# Patient Record
Sex: Female | Born: 1975 | Race: White | Hispanic: No | Marital: Married | Smoking: Never smoker
Health system: Southern US, Community
[De-identification: ages and names within clinical notes are randomized; demographics above are authoritative.]

## PROBLEM LIST (undated history)

## (undated) DIAGNOSIS — J45909 Unspecified asthma, uncomplicated: Secondary | ICD-10-CM

## (undated) DIAGNOSIS — R87629 Unspecified abnormal cytological findings in specimens from vagina: Secondary | ICD-10-CM

## (undated) DIAGNOSIS — G47419 Narcolepsy without cataplexy: Secondary | ICD-10-CM

## (undated) DIAGNOSIS — B009 Herpesviral infection, unspecified: Secondary | ICD-10-CM

## (undated) DIAGNOSIS — E042 Nontoxic multinodular goiter: Secondary | ICD-10-CM

## (undated) DIAGNOSIS — Z8719 Personal history of other diseases of the digestive system: Secondary | ICD-10-CM

## (undated) DIAGNOSIS — K589 Irritable bowel syndrome without diarrhea: Secondary | ICD-10-CM

## (undated) DIAGNOSIS — O09529 Supervision of elderly multigravida, unspecified trimester: Secondary | ICD-10-CM

## (undated) DIAGNOSIS — I639 Cerebral infarction, unspecified: Secondary | ICD-10-CM

## (undated) HISTORY — DX: Herpesviral infection, unspecified: B00.9

## (undated) HISTORY — PX: EYE SURGERY: SHX253

## (undated) HISTORY — DX: Nontoxic multinodular goiter: E04.2

## (undated) HISTORY — DX: Unspecified asthma, uncomplicated: J45.909

## (undated) HISTORY — DX: Personal history of other diseases of the digestive system: Z87.19

## (undated) HISTORY — DX: Cerebral infarction, unspecified: I63.9

## (undated) HISTORY — DX: Irritable bowel syndrome, unspecified: K58.9

## (undated) HISTORY — PX: DILATION AND CURETTAGE OF UTERUS: SHX78

## (undated) HISTORY — DX: Supervision of elderly multigravida, unspecified trimester: O09.529

## (undated) HISTORY — DX: Narcolepsy without cataplexy: G47.419

## (undated) HISTORY — PX: BREAST SURGERY: SHX581

## (undated) HISTORY — DX: Unspecified abnormal cytological findings in specimens from vagina: R87.629

---

## 2009-11-10 LAB — CONVERTED CEMR LAB: Pap Smear: NORMAL

## 2010-01-13 ENCOUNTER — Ambulatory Visit (HOSPITAL_BASED_OUTPATIENT_CLINIC_OR_DEPARTMENT_OTHER)
Admission: RE | Admit: 2010-01-13 | Discharge: 2010-01-13 | Payer: Self-pay | Source: Home / Self Care | Admitting: Orthopaedic Surgery

## 2010-01-13 ENCOUNTER — Ambulatory Visit: Payer: Self-pay | Admitting: Diagnostic Radiology

## 2010-04-01 ENCOUNTER — Ambulatory Visit: Payer: Self-pay | Admitting: Internal Medicine

## 2010-04-01 DIAGNOSIS — J45909 Unspecified asthma, uncomplicated: Secondary | ICD-10-CM | POA: Insufficient documentation

## 2010-04-01 DIAGNOSIS — R51 Headache: Secondary | ICD-10-CM

## 2010-04-01 DIAGNOSIS — F329 Major depressive disorder, single episode, unspecified: Secondary | ICD-10-CM

## 2010-04-01 DIAGNOSIS — R350 Frequency of micturition: Secondary | ICD-10-CM

## 2010-04-01 DIAGNOSIS — R519 Headache, unspecified: Secondary | ICD-10-CM | POA: Insufficient documentation

## 2010-04-01 DIAGNOSIS — Z8679 Personal history of other diseases of the circulatory system: Secondary | ICD-10-CM | POA: Insufficient documentation

## 2010-04-01 LAB — CONVERTED CEMR LAB
Beta hcg, urine, semiquantitative: NEGATIVE
Bilirubin Urine: NEGATIVE
Blood in Urine, dipstick: NEGATIVE
GC Probe Amp, Urine: NEGATIVE
Glucose, Urine, Semiquant: NEGATIVE
Nitrite: NEGATIVE
Protein, U semiquant: 30
Specific Gravity, Urine: >=1.03
Urobilinogen, UA: 0.2
WBC Urine, dipstick: NEGATIVE
pH: 5

## 2010-04-02 ENCOUNTER — Telehealth: Payer: Self-pay | Admitting: Internal Medicine

## 2010-04-06 ENCOUNTER — Telehealth: Payer: Self-pay | Admitting: Internal Medicine

## 2010-04-08 ENCOUNTER — Encounter: Payer: Self-pay | Admitting: Internal Medicine

## 2010-05-05 ENCOUNTER — Encounter: Payer: Self-pay | Admitting: Internal Medicine

## 2010-05-05 ENCOUNTER — Ambulatory Visit: Payer: Self-pay | Admitting: Family

## 2010-05-05 DIAGNOSIS — N926 Irregular menstruation, unspecified: Secondary | ICD-10-CM | POA: Insufficient documentation

## 2010-05-05 DIAGNOSIS — H6690 Otitis media, unspecified, unspecified ear: Secondary | ICD-10-CM | POA: Insufficient documentation

## 2010-05-05 DIAGNOSIS — N939 Abnormal uterine and vaginal bleeding, unspecified: Secondary | ICD-10-CM

## 2010-05-05 LAB — CONVERTED CEMR LAB
Beta hcg, urine, semiquantitative: NEGATIVE
Bilirubin Urine: NEGATIVE
Blood in Urine, dipstick: NEGATIVE
Estradiol: 26.2 pg/mL
FSH: 7.5 milliintl units/mL
Glucose, Urine, Semiquant: NEGATIVE
Ketones, urine, test strip: NEGATIVE
LH: 4.3 milliintl units/mL
Nitrite: NEGATIVE
Protein, U semiquant: NEGATIVE
Specific Gravity, Urine: 1.025
TSH: 0.627 microintl units/mL (ref 0.350–4.500)
Urobilinogen, UA: 0.2
WBC Urine, dipstick: NEGATIVE
pH: 6.5

## 2010-05-06 ENCOUNTER — Telehealth: Payer: Self-pay | Admitting: Internal Medicine

## 2010-05-06 LAB — CONVERTED CEMR LAB
Candida species: NEGATIVE
Chlamydia, DNA Probe: NEGATIVE
GC Probe Amp, Genital: NEGATIVE
Gardnerella vaginalis: NEGATIVE
Trichomonal Vaginitis: NEGATIVE

## 2010-05-07 ENCOUNTER — Telehealth: Payer: Self-pay | Admitting: Family

## 2010-05-07 ENCOUNTER — Encounter: Payer: Self-pay | Admitting: Family

## 2010-05-13 ENCOUNTER — Ambulatory Visit (HOSPITAL_BASED_OUTPATIENT_CLINIC_OR_DEPARTMENT_OTHER): Admission: RE | Admit: 2010-05-13 | Discharge: 2010-05-13 | Payer: Self-pay | Admitting: Internal Medicine

## 2010-05-13 ENCOUNTER — Ambulatory Visit: Payer: Self-pay | Admitting: Diagnostic Radiology

## 2010-05-14 ENCOUNTER — Telehealth: Payer: Self-pay | Admitting: Family

## 2010-08-31 NOTE — Letter (Signed)
   [facility] [address] [place], [place]  [address]  [place] Phone: [phone]      May 07, 2010   Bianca Jones [address] [place], [place] [address]  RE:  LAB RESULTS  Dear  Ms. Gatchell,  The following is an interpretation of your most recent lab tests.  Please take note of any instructions provided or changes to medications that have resulted from your lab work.  THYROID STUDIES:  Thyroid studies normal TSH: 0.627    Your hormonal studies are normal for your age.  Please complete your upcoming ultrasound.     Sincerely Yours,    Lemont Fillers FNP  Appended Document:  Mailed.

## 2010-08-31 NOTE — Consult Note (Signed)
Summary: [facility]  [facility]   Imported By: Lanelle Bal 04/27/2010 09:43:21  _____________________________________________________________________  External Attachment:    Type:   Image     Comment:   External Document

## 2010-08-31 NOTE — Progress Notes (Signed)
Summary: lab results  Phone Note Outgoing Call   Summary of Call: Please call patient and let her know that her hormonal studies and ultrasound are normal. Initial call taken by: Lemont Fillers FNP,  May 14, 2010 8:18 AM  Follow-up for Phone Call        Left detailed message re: results on pt's cell and to call if any questions. Nicki Guadalajara Fergerson CMA Duncan Dull)  May 14, 2010 11:54 AM

## 2010-08-31 NOTE — Progress Notes (Signed)
Summary: lab results  Phone Note Outgoing Call   Summary of Call: Please call patient and let her know that her wet prep is normal.  GC/Chlamydia negative.  Hormone studies normal. Initial call taken by: Lemont Fillers FNP,  May 07, 2010 10:15 AM  Follow-up for Phone Call        Notified pt per [place] instructions. Advised pt we will call her when urine culture and u/s results come back. Pt states she will be having u/s this coming thursday. Nicki Guadalajara Fergerson CMA Duncan Dull)  May 07, 2010 10:46 AM

## 2010-08-31 NOTE — Progress Notes (Signed)
  Phone Note Outgoing Call   Summary of Call: pt still having urinary symptoms.  discussed urine culture results.   tx with cipro x 7 days Initial call taken by: D. Thomos Lemons DO,  April 06, 2010 6:18 PM    New/Updated Medications: CIPROFLOXACIN HCL 500 MG TABS (CIPROFLOXACIN HCL) one by mouth two times a day Prescriptions: CIPROFLOXACIN HCL 500 MG TABS (CIPROFLOXACIN HCL) one by mouth two times a day  #14 x 0   Entered and Authorized by:   D. Thomos Lemons DO   Signed by:   D. Thomos Lemons DO on 04/06/2010   Method used:   Electronically to        [facility] (retail)       [address]       [place], [place]  [address]       Ph: [phone]       Fax: [phone]   RxID:   [id number]

## 2010-08-31 NOTE — Progress Notes (Signed)
Summary: Bianca Jones needs to change test code  Phone Note Other Incoming Call back at [phone]   Caller: [employer] Bianca Jones Summary of Call: Pls call to give permission to use test code [id number] Initial call taken by: Lannette Donath,  May 06, 2010 11:14 AM  Follow-up for Phone Call        call was returned to [facility] customer service Laurelyn Sickle she states test ordered for the wet prep was by [facility] and they need it to be molecular probe. She was advised the test could be changed as requested Follow-up by: Glendell Docker CMA,  May 06, 2010 12:01 PM

## 2010-08-31 NOTE — Assessment & Plan Note (Signed)
Summary: Pap only/    Hea--Rm 5   Vital Signs:  Patient profile:   35 year old female Menstrual status:  irregular LMP:     05/04/2010 Height:      66 inches Weight:      159 pounds BMI:     25.76 Temp:     98.3 degrees F oral Pulse rate:   84 / minute Pulse rhythm:   regular Resp:     18 per minute BP sitting:   112 / 84  (right arm) Cuff size:   regular  Vitals Entered By: Mervin Kung CMA Duncan Dull) (May 05, 2010 3:47 PM) CC: Rm 5   Pt here for pap smear.  Pt states cycle has been irregular for the last 3 months. Heavy bleeding yesterday. No bleeding today. Is Patient Diabetic? No Pain Assessment Patient in pain? yes     Location: lower abdomen and lower back Intensity: uncomfortable feeling at present Comments Pt has completed Cipro. Takes Nuvigil for Narcolepsy. Nicki Guadalajara Fergerson CMA Duncan Dull)  May 05, 2010 3:55 PM  LMP (date): 05/04/2010     Menstrual Status irregular Enter LMP: 05/04/2010 Last PAP Result normal   Primary Care Provider:  Dondra Spry DO  CC:  Rm 5   Pt here for pap smear.  Pt states cycle has been irregular for the last 3 months. Heavy bleeding yesterday. No bleeding today.Marland Kitchen  History of Present Illness: Ms Stavola is a 35 year old female who presents today with complaint of irregular periods, 3 periods in 5 weeks.  + abdominal pain/cramping, + history of ovarian cysts.  Denies dysuria. Denies vaginal discharge.  + vaginal odor.  1 sexual partner in 3-4 years.   Some difficulty defecating at times.  Denies constipation.  She notes that she had an MRI of her L/S spine which was negative.  Review of [place] records reveals that this was ordered by Dr. Norlene Campbell and revealed right foraminal protrusion at L3-4 contacts the right L3 root without compressing it, as well asd isc bulge eccentric the left at L5-S1 slightly displaces the descending left S1 root without compressing it.   Patient also reports recent URI with fatigue.    Allergies  (verified): No Known Drug Allergies  Physical Exam  General:  Well-developed,well-nourished,in no acute distress; alert,appropriate and cooperative throughout examination Head:  Normocephalic and atraumatic without obvious abnormalities. No apparent alopecia or balding. Ears:  R TM with + erythema.  L TM normal. Mouth:  Oral mucosa and oropharynx without lesions or exudates.  Teeth in good repair. Lungs:  Normal respiratory effort, chest expands symmetrically. Lungs are clear to auscultation, no crackles or wheezes. Heart:  Normal rate and regular rhythm. S1 and S2 normal without gallop, murmur, click, rub or other extra sounds. Abdomen:  Bowel sounds positive,abdomen soft and non-tender without masses, organomegaly or hernias noted. Genitalia:  Pelvic Exam:        External: normal female genitalia without lesions or masses        Vagina: normal without lesions or masses        Cervix: normal without lesions or masses        Adnexa: normal bimanual exam without masses or fullness        Uterus: normal by palpation        Pap smear: not performed   Impression & Recommendations:  Problem # 1:  MENSTRUAL BLEEDING, ABNORMAL (ICD-626.9) Assessment New Pap was completed in the spring.  Due to  patient's complaints will order wet prep and GC/Chlamydia.  Check hormonal studies and will send for a transvaginal US to rule out fibroids.   Orders: T-FSH [id number]) T-LH [id number]) T-Estradiol [id number]) T-TSH [id number]) T-Wet Prep [phone]) T-Chlamydia & GC Probe, Genital ([id number]) Urine Pregnancy Test  [phone]) Misc. Referral (Misc. Ref)  Problem # 2:  OTITIS MEDIA, RIGHT (ICD-382.9) Assessment: New Will treat with Ceftin.   The following medications were removed from the medication list:    Ciprofloxacin Hcl 500 Mg Tabs (Ciprofloxacin hcl) ..... One by mouth two times a day Her updated medication list for this problem includes:    Ceftin 500 Mg Tabs (Cefuroxime  axetil) ..... One tablet by mouth two times a day x 7 days  Complete Medication List: 1)  Nuvigil 150 Mg Tabs (Armodafinil) .... Take 1 tablet every morning and 1/2 @ 4pm as needed 2)  Ceftin 500 Mg Tabs (Cefuroxime axetil) .... One tablet by mouth two times a day x 7 days  Other Orders: UA Dipstick w/o Micro (manual) ([id number]) Specimen Handling (99000) T-Culture, Urine ([id number])  Patient Instructions: 1)  Follow up with Dr. Artist Pais in 1 month. 2)  Call if your symptoms worsen or do not improve. Prescriptions: CEFTIN 500 MG TABS (CEFUROXIME AXETIL) one tablet by mouth two times a day x 7 days  #14 x 0   Entered and Authorized by:   Lemont Fillers FNP   Signed by:   Lemont Fillers FNP on 05/05/2010   Method used:   Electronically to        [facility] (retail)       [address]       [place], [place]  [address]       Ph: [phone]       Fax: [phone]   RxID:   [id number]   Current Allergies (reviewed today): No known allergies     Laboratory Results   Urine Tests   Date/Time Reported: Mervin Kung CMA Duncan Dull)  May 05, 2010 4:35 PM   Routine Urinalysis   Color: lt. yellow Appearance: Clear Glucose: negative   (Normal Range: Negative) Bilirubin: negative   (Normal Range: Negative) Ketone: negative   (Normal Range: Negative) Spec. Gravity: 1.025   (Normal Range: 1.003-1.035) Blood: negative   (Normal Range: Negative) pH: 6.5   (Normal Range: 5.0-8.0) Protein: negative   (Normal Range: Negative) Urobilinogen: 0.2   (Normal Range: 0-1) Nitrite: negative   (Normal Range: Negative) Leukocyte Esterace: negative   (Normal Range: Negative)    Urine HCG: negative Comments: Urine cultured. Nicki Guadalajara Fergerson CMA Duncan Dull)  May 05, 2010 4:36 PM

## 2010-08-31 NOTE — Progress Notes (Signed)
Summary: Lab Results  Phone Note Outgoing Call   Summary of Call: call pt - urine test for chlamydia - negative Initial call taken by: D. Thomos Lemons DO,  April 02, 2010 6:20 PM  Follow-up for Phone Call        call placed to patient at [phone], no answer, a detailed voice message left informing patient per Dr Artist Pais instructions. Message left for patient to call if any questions Follow-up by: Glendell Docker CMA,  April 06, 2010 11:31 AM

## 2010-08-31 NOTE — Assessment & Plan Note (Signed)
Summary: NEW PT EST CARE/DT   Vital Signs:  Patient profile:   35 year old female Menstrual status:  regular LMP:     03/25/2010 Height:      66 inches Weight:      160 pounds BMI:     25.92 O2 Sat:      99 % on Room air Temp:     98.1 degrees F oral Pulse rate:   86 / minute Pulse rhythm:   regular Resp:     20 per minute BP sitting:   100 / 68  (right arm) Cuff size:   large  Vitals Entered By: Glendell Docker CMA (April 01, 2010 2:58 PM)  O2 Flow:  Room air CC: New Patient  Is Patient Diabetic? No Pain Assessment Patient in pain? no      LMP (date): 03/25/2010     Menstrual Status regular Enter LMP: 03/25/2010 Last PAP Result normal   Primary Care Provider:  Dondra Spry DO  CC:  New Patient .  History of Present Illness: 35 y/o white female to establish hx of chronic back pain sees chiropractor  Bladder - pressure sensation freq, some urgency, occ fever, no burning poor muscle control, feels like she has to push to get urine out drinking more fluids  3 months  went to [place] - suggested bladder scan no blood in urine no hx of chronic urinary infections no otc supplements  No vaginal symptoms nuvagil Dr. Bascom Levels - GYN HH - antispasmodic,  on hycosamine  Preventive Screening-Counseling & Management  Alcohol-Tobacco     Alcohol drinks/day: <1     Smoking Status: never  Caffeine-Diet-Exercise     Caffeine use/day: 1 beverage daily     Does Patient Exercise: no  Past History:  Past Medical History: Childhood Asthma Situational Depression (college - family member died and break up)  stayed on zoloft x 2,  then tapered off - (fam hx of depression Aunt) Headaches - worse after small stroke Cerebrovascular accident, hx of - (2008) speech was abnormal,  MRI was abnormal showing small CVA  followed by neurologist - Dr Albertina Senegal (no specific etiology)  subsequent MRIs was normal Hx of narcolepsy  Past Surgical History: Bilateral eye  msucle surgery  1977 Right Breast Biopsy in 1999  Family History: Family History of Arthritis -father (RA and OA)  age 23 Family History of CAD Female - GF Family History High cholesterol - GF, both parents Family History Hypertension - mother aunt died of lung cancer no family hx of lupus one brother   Social History: Occupation: Runner, 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    Physical Exam  General:  alert, well-developed, and well-nourished.   Head:  normocephalic and atraumatic.   Eyes:  pupils equal, pupils round, and pupils reactive to light.   Ears:  R ear normal and L ear normal.   Mouth:  good dentition, no gingival abnormalities, no dental plaque, and pharynx pink and moist.   Neck:  No deformities, masses, or tenderness noted.no carotid bruits.   Lungs:  normal respiratory effort, normal breath sounds, no crackles, and no wheezes.   Heart:  normal rate, regular rhythm, no murmur, and no gallop.  Abdomen:  soft, non-tender, normal bowel sounds, no masses, no hepatomegaly, and no splenomegaly.   Extremities:  No lower extremity edema  Neurologic:  cranial nerves II-XII intact and gait normal.   Psych:  normally interactive and good eye contact.     Impression & Recommendations:  Problem # 1:  FREQUENCY, URINARY (ICD-788.41)  u/a is negative - check chlamydia probe  refer to urology for further eval consider interstitial cystitis she also has unusual hx of small CVA in the past question neurogenic bladder  If urodynamics show neurogenic bladder - consider reassess neurolgic status  Orders: T-Culture, Urine ([id number])  Orders: T-Culture, Urine  ([id number])  Problem # 2:  CEREBROVASCULAR ACCIDENT, HX OF (ICD-V12.50) Obtain copy of prev records  Complete Medication List: 1)  Ciprofloxacin Hcl 500 Mg Tabs (Ciprofloxacin hcl) .... One by mouth two times a day  Other Orders: T-GC Probe, urine ([id number]) Specimen Handling ([id number])  Patient Instructions: 1)  Please schedule a follow-up appointment in 1 month. Prescriptions: DOXYCYCLINE HYCLATE 100 MG TABS (DOXYCYCLINE HYCLATE) one by mouth two times a day  #14 x 0   Entered and Authorized by:   D. Thomos Lemons DO   Signed by:   D. Thomos Lemons DO on 04/01/2010   Method used:   Print then Give to Patient   RxID:   [id number]    Preventive Care Screening  Pap Smear:    Date:  11/10/2009    Results:  normal   Last Tetanus Booster:    Date:  04/19/2005    Results:  Historical     Laboratory Results   Urine Tests    Routine Urinalysis   Color: straw Appearance: Hazy Glucose: negative   (Normal Range: Negative) Bilirubin: negative   (Normal Range: Negative) Ketone: moderate (40)   (Normal Range: Negative) Spec. Gravity: >=1.030   (Normal Range: 1.003-1.035) Blood: negative   (Normal Range: Negative) pH: 5.0   (Normal Range: 5.0-8.0) Protein: 30   (Normal Range: Negative) Urobilinogen: 0.2   (Normal Range: 0-1) Nitrite: negative   (Normal Range: Negative) Leukocyte Esterace: negative   (Normal Range: Negative)    Urine HCG: negative

## 2013-05-28 LAB — OB RESULTS CONSOLE HIV ANTIBODY (ROUTINE TESTING): HIV: NONREACTIVE

## 2013-05-28 LAB — OB RESULTS CONSOLE RPR: RPR: NONREACTIVE

## 2013-05-28 LAB — OB RESULTS CONSOLE HEPATITIS B SURFACE ANTIGEN: Hepatitis B Surface Ag: NEGATIVE

## 2013-05-28 LAB — OB RESULTS CONSOLE ABO/RH: RH Type: POSITIVE

## 2013-05-28 LAB — OB RESULTS CONSOLE RUBELLA ANTIBODY, IGM: Rubella: IMMUNE

## 2013-05-28 LAB — OB RESULTS CONSOLE ANTIBODY SCREEN: Antibody Screen: NEGATIVE

## 2013-06-16 LAB — OB RESULTS CONSOLE GC/CHLAMYDIA: Chlamydia: POSITIVE

## 2013-11-26 LAB — OB RESULTS CONSOLE GBS: GBS: NEGATIVE

## 2013-12-26 ENCOUNTER — Telehealth (HOSPITAL_COMMUNITY): Payer: Self-pay | Admitting: *Deleted

## 2013-12-26 ENCOUNTER — Encounter (HOSPITAL_COMMUNITY): Payer: Self-pay | Admitting: *Deleted

## 2013-12-27 ENCOUNTER — Encounter (HOSPITAL_COMMUNITY): Payer: Self-pay | Admitting: *Deleted

## 2013-12-27 NOTE — Telephone Encounter (Signed)
Preadmission screen  

## 2013-12-30 ENCOUNTER — Inpatient Hospital Stay (HOSPITAL_COMMUNITY)
Admission: RE | Admit: 2013-12-30 | Discharge: 2014-01-03 | DRG: 765 | Disposition: A | Discharge status: Home / Self Care | Payer: BC Managed Care – PPO | Source: Ambulatory Visit | Attending: Obstetrics and Gynecology | Admitting: Obstetrics and Gynecology

## 2013-12-30 ENCOUNTER — Encounter (HOSPITAL_COMMUNITY): Payer: Self-pay

## 2013-12-30 VITALS — BP 100/66 | HR 75 | Temp 97.5°F | Resp 18 | Ht 67.0 in | Wt 195.0 lb

## 2013-12-30 DIAGNOSIS — O99344 Other mental disorders complicating childbirth: Secondary | ICD-10-CM | POA: Diagnosis present

## 2013-12-30 DIAGNOSIS — Z8249 Family history of ischemic heart disease and other diseases of the circulatory system: Secondary | ICD-10-CM

## 2013-12-30 DIAGNOSIS — F329 Major depressive disorder, single episode, unspecified: Secondary | ICD-10-CM | POA: Diagnosis present

## 2013-12-30 DIAGNOSIS — O480 Post-term pregnancy: Secondary | ICD-10-CM | POA: Diagnosis present

## 2013-12-30 DIAGNOSIS — Z98891 History of uterine scar from previous surgery: Secondary | ICD-10-CM

## 2013-12-30 DIAGNOSIS — F3289 Other specified depressive episodes: Secondary | ICD-10-CM | POA: Diagnosis present

## 2013-12-30 DIAGNOSIS — O98519 Other viral diseases complicating pregnancy, unspecified trimester: Secondary | ICD-10-CM | POA: Diagnosis present

## 2013-12-30 DIAGNOSIS — K449 Diaphragmatic hernia without obstruction or gangrene: Secondary | ICD-10-CM | POA: Diagnosis present

## 2013-12-30 DIAGNOSIS — K219 Gastro-esophageal reflux disease without esophagitis: Secondary | ICD-10-CM | POA: Diagnosis present

## 2013-12-30 DIAGNOSIS — O09519 Supervision of elderly primigravida, unspecified trimester: Secondary | ICD-10-CM | POA: Diagnosis present

## 2013-12-30 DIAGNOSIS — J45909 Unspecified asthma, uncomplicated: Secondary | ICD-10-CM | POA: Diagnosis present

## 2013-12-30 DIAGNOSIS — D649 Anemia, unspecified: Secondary | ICD-10-CM | POA: Diagnosis present

## 2013-12-30 DIAGNOSIS — A6000 Herpesviral infection of urogenital system, unspecified: Secondary | ICD-10-CM | POA: Diagnosis present

## 2013-12-30 DIAGNOSIS — Z3490 Encounter for supervision of normal pregnancy, unspecified, unspecified trimester: Secondary | ICD-10-CM

## 2013-12-30 DIAGNOSIS — O9902 Anemia complicating childbirth: Secondary | ICD-10-CM | POA: Diagnosis present

## 2013-12-30 DIAGNOSIS — Z8673 Personal history of transient ischemic attack (TIA), and cerebral infarction without residual deficits: Secondary | ICD-10-CM

## 2013-12-30 DIAGNOSIS — K589 Irritable bowel syndrome without diarrhea: Secondary | ICD-10-CM | POA: Diagnosis present

## 2013-12-30 LAB — CBC
HEMATOCRIT: 33.8 % — AB (ref 36.0–46.0)
HEMOGLOBIN: 11.5 g/dL — AB (ref 12.0–15.0)
MCH: 29.9 pg (ref 26.0–34.0)
MCHC: 34 g/dL (ref 30.0–36.0)
MCV: 88 fL (ref 78.0–100.0)
Platelets: 251 10*3/uL (ref 150–400)
RBC: 3.84 MIL/uL — ABNORMAL LOW (ref 3.87–5.11)
RDW: 13.2 % (ref 11.5–15.5)
WBC: 13.9 10*3/uL — ABNORMAL HIGH (ref 4.0–10.5)

## 2013-12-30 MED ORDER — OXYTOCIN 40 UNITS IN LACTATED RINGERS INFUSION - SIMPLE MED: 62.5000 mL/h | INTRAVENOUS | Status: DC

## 2013-12-30 MED ORDER — LACTATED RINGERS IV SOLN: 500.0000 mL | INTRAVENOUS | Status: DC | PRN

## 2013-12-30 MED ORDER — LIDOCAINE HCL (PF) 1 % IJ SOLN: 30.0000 mL | INTRAMUSCULAR | Status: DC | PRN

## 2013-12-30 MED ORDER — MISOPROSTOL 25 MCG QUARTER TABLET
25.0000 ug | ORAL_TABLET | ORAL | Status: AC | PRN
  Administered 2013-12-30 – 2013-12-31 (×2): 25 ug via VAGINAL
  Filled 2013-12-30 (×2): qty 0.25

## 2013-12-30 MED ORDER — OXYTOCIN 40 UNITS IN LACTATED RINGERS INFUSION - SIMPLE MED
1.0000 m[IU]/min | INTRAVENOUS | Status: DC
Start: 2013-12-31 — End: 2013-12-31
  Administered 2013-12-31: 2 m[IU]/min via INTRAVENOUS
  Filled 2013-12-30: qty 1000

## 2013-12-30 MED ORDER — IBUPROFEN 600 MG PO TABS: 600.0000 mg | ORAL_TABLET | Freq: Four times a day (QID) | ORAL | Status: DC | PRN

## 2013-12-30 MED ORDER — CITRIC ACID-SODIUM CITRATE 334-500 MG/5ML PO SOLN
30.0000 mL | ORAL | Status: DC | PRN
  Administered 2013-12-31: 30 mL via ORAL
  Filled 2013-12-30: qty 15

## 2013-12-30 MED ORDER — BUTORPHANOL TARTRATE 1 MG/ML IJ SOLN
1.0000 mg | INTRAMUSCULAR | Status: DC | PRN
  Administered 2013-12-31: 1 mg via INTRAVENOUS
  Filled 2013-12-30: qty 1

## 2013-12-30 MED ORDER — ACETAMINOPHEN 325 MG PO TABS
650.0000 mg | ORAL_TABLET | ORAL | Status: DC | PRN
Start: 2013-12-30 — End: 2013-12-31

## 2013-12-30 MED ORDER — TERBUTALINE SULFATE 1 MG/ML IJ SOLN: 0.2500 mg | Freq: Once | INTRAMUSCULAR | Status: AC | PRN

## 2013-12-30 MED ORDER — OXYCODONE-ACETAMINOPHEN 5-325 MG PO TABS: 1.0000 | ORAL_TABLET | ORAL | Status: DC | PRN

## 2013-12-30 MED ORDER — ONDANSETRON HCL 4 MG/2ML IJ SOLN: 4.0000 mg | Freq: Four times a day (QID) | INTRAMUSCULAR | Status: DC | PRN

## 2013-12-30 MED ORDER — LACTATED RINGERS IV SOLN
INTRAVENOUS | Status: DC
Start: 2013-12-30 — End: 2013-12-31
  Administered 2013-12-30 – 2013-12-31 (×3): via INTRAVENOUS
  Administered 2013-12-31 (×2): 125 mL/h via INTRAVENOUS
  Administered 2013-12-31: 07:00:00 via INTRAVENOUS

## 2013-12-30 MED ORDER — ZOLPIDEM TARTRATE 5 MG PO TABS
5.0000 mg | ORAL_TABLET | Freq: Every evening | ORAL | Status: DC | PRN
  Administered 2013-12-31: 5 mg via ORAL
  Filled 2013-12-30: qty 1

## 2013-12-30 MED ORDER — OXYTOCIN BOLUS FROM INFUSION
500.0000 mL | INTRAVENOUS | Status: DC
Start: 2013-12-30 — End: 2013-12-31

## 2013-12-31 ENCOUNTER — Encounter (HOSPITAL_COMMUNITY): Payer: Self-pay

## 2013-12-31 ENCOUNTER — Encounter (HOSPITAL_COMMUNITY): Payer: BC Managed Care – PPO | Admitting: Anesthesiology

## 2013-12-31 ENCOUNTER — Inpatient Hospital Stay (HOSPITAL_COMMUNITY): Payer: BC Managed Care – PPO | Admitting: Anesthesiology

## 2013-12-31 ENCOUNTER — Encounter (HOSPITAL_COMMUNITY)
Admission: RE | Disposition: A | Discharge status: Home / Self Care | Payer: Self-pay | Source: Ambulatory Visit | Attending: Obstetrics and Gynecology

## 2013-12-31 DIAGNOSIS — Z98891 History of uterine scar from previous surgery: Secondary | ICD-10-CM

## 2013-12-31 LAB — ABO/RH: ABO/RH(D): O POS

## 2013-12-31 LAB — TYPE AND SCREEN
ABO/RH(D): O POS
ANTIBODY SCREEN: NEGATIVE

## 2013-12-31 LAB — RPR

## 2013-12-31 SURGERY — Surgical Case
Anesthesia: Epidural

## 2013-12-31 MED ORDER — EPHEDRINE 5 MG/ML INJ
INTRAVENOUS | Status: AC
  Filled 2013-12-31: qty 4

## 2013-12-31 MED ORDER — LACTATED RINGERS IV SOLN
500.0000 mL | Freq: Once | INTRAVENOUS | Status: AC
  Administered 2013-12-31: 500 mL via INTRAVENOUS

## 2013-12-31 MED ORDER — KETOROLAC TROMETHAMINE 30 MG/ML IJ SOLN
INTRAMUSCULAR | Status: AC
  Filled 2013-12-31: qty 1

## 2013-12-31 MED ORDER — ONDANSETRON HCL 4 MG PO TABS: 4.0000 mg | ORAL_TABLET | ORAL | Status: DC | PRN

## 2013-12-31 MED ORDER — SENNOSIDES-DOCUSATE SODIUM 8.6-50 MG PO TABS
2.0000 | ORAL_TABLET | ORAL | Status: DC
  Administered 2014-01-01 – 2014-01-02 (×3): 2 via ORAL
  Filled 2013-12-31 (×3): qty 2

## 2013-12-31 MED ORDER — FENTANYL 2.5 MCG/ML BUPIVACAINE 1/10 % EPIDURAL INFUSION (WH - ANES)
INTRAMUSCULAR | Status: AC
  Filled 2013-12-31: qty 125

## 2013-12-31 MED ORDER — DIPHENHYDRAMINE HCL 25 MG PO CAPS: 25.0000 mg | ORAL_CAPSULE | ORAL | Status: DC | PRN

## 2013-12-31 MED ORDER — SCOPOLAMINE 1 MG/3DAYS TD PT72
MEDICATED_PATCH | TRANSDERMAL | Status: AC
  Filled 2013-12-31: qty 1

## 2013-12-31 MED ORDER — MORPHINE SULFATE (PF) 0.5 MG/ML IJ SOLN
INTRAMUSCULAR | Status: DC | PRN
  Administered 2013-12-31: 3 mg via EPIDURAL

## 2013-12-31 MED ORDER — KETOROLAC TROMETHAMINE 30 MG/ML IJ SOLN: 30.0000 mg | Freq: Four times a day (QID) | INTRAMUSCULAR | Status: AC | PRN

## 2013-12-31 MED ORDER — DIPHENHYDRAMINE HCL 50 MG/ML IJ SOLN: 12.5000 mg | INTRAMUSCULAR | Status: DC | PRN

## 2013-12-31 MED ORDER — FENTANYL CITRATE 0.05 MG/ML IJ SOLN
INTRAMUSCULAR | Status: DC | PRN
  Administered 2013-12-31: 100 ug via EPIDURAL

## 2013-12-31 MED ORDER — MORPHINE SULFATE 0.5 MG/ML IJ SOLN
INTRAMUSCULAR | Status: AC
  Filled 2013-12-31: qty 10

## 2013-12-31 MED ORDER — SODIUM BICARBONATE 8.4 % IV SOLN
INTRAVENOUS | Status: DC | PRN
  Administered 2013-12-31 (×3): 5 mL via EPIDURAL

## 2013-12-31 MED ORDER — OXYTOCIN 40 UNITS IN LACTATED RINGERS INFUSION - SIMPLE MED: 62.5000 mL/h | INTRAVENOUS | Status: AC

## 2013-12-31 MED ORDER — DIBUCAINE 1 % RE OINT: 1.0000 "application " | TOPICAL_OINTMENT | RECTAL | Status: DC | PRN

## 2013-12-31 MED ORDER — OXYCODONE-ACETAMINOPHEN 5-325 MG PO TABS
1.0000 | ORAL_TABLET | ORAL | Status: DC | PRN
  Administered 2014-01-01: 1 via ORAL
  Administered 2014-01-01 – 2014-01-02 (×2): 2 via ORAL
  Administered 2014-01-03: 1 via ORAL
  Filled 2013-12-31: qty 2
  Filled 2013-12-31 (×2): qty 1
  Filled 2013-12-31: qty 2

## 2013-12-31 MED ORDER — SODIUM CHLORIDE 0.9 % IJ SOLN: 3.0000 mL | INTRAMUSCULAR | Status: DC | PRN

## 2013-12-31 MED ORDER — ONDANSETRON HCL 4 MG/2ML IJ SOLN
INTRAMUSCULAR | Status: DC | PRN
  Administered 2013-12-31: 4 mg via INTRAVENOUS

## 2013-12-31 MED ORDER — SIMETHICONE 80 MG PO CHEW
80.0000 mg | CHEWABLE_TABLET | Freq: Three times a day (TID) | ORAL | Status: DC
Start: 2014-01-01 — End: 2014-01-03
  Administered 2014-01-01 – 2014-01-03 (×6): 80 mg via ORAL
  Filled 2013-12-31 (×6): qty 1

## 2013-12-31 MED ORDER — CEFAZOLIN SODIUM-DEXTROSE 2-3 GM-% IV SOLR
INTRAVENOUS | Status: DC | PRN
  Administered 2013-12-31: 2 g via INTRAVENOUS

## 2013-12-31 MED ORDER — ZOLPIDEM TARTRATE 5 MG PO TABS: 5.0000 mg | ORAL_TABLET | Freq: Every evening | ORAL | Status: DC | PRN

## 2013-12-31 MED ORDER — SIMETHICONE 80 MG PO CHEW: 80.0000 mg | CHEWABLE_TABLET | ORAL | Status: DC | PRN

## 2013-12-31 MED ORDER — FENTANYL 2.5 MCG/ML BUPIVACAINE 1/10 % EPIDURAL INFUSION (WH - ANES)
INTRAMUSCULAR | Status: DC | PRN
  Administered 2013-12-31: 14 mL/h via EPIDURAL

## 2013-12-31 MED ORDER — FENTANYL 2.5 MCG/ML BUPIVACAINE 1/10 % EPIDURAL INFUSION (WH - ANES)
14.0000 mL/h | INTRAMUSCULAR | Status: DC | PRN
  Administered 2013-12-31: 14 mL/h via EPIDURAL
  Filled 2013-12-31: qty 125

## 2013-12-31 MED ORDER — LIDOCAINE HCL (PF) 1 % IJ SOLN
INTRAMUSCULAR | Status: DC | PRN
  Administered 2013-12-31: 8 mL
  Administered 2013-12-31: 9 mL

## 2013-12-31 MED ORDER — LACTATED RINGERS IV SOLN
INTRAVENOUS | Status: DC
Start: 2013-12-31 — End: 2014-01-03
  Administered 2014-01-01: 05:00:00 via INTRAVENOUS

## 2013-12-31 MED ORDER — OXYTOCIN 10 UNIT/ML IJ SOLN
INTRAMUSCULAR | Status: AC
  Filled 2013-12-31: qty 4

## 2013-12-31 MED ORDER — LANOLIN HYDROUS EX OINT
1.0000 | TOPICAL_OINTMENT | CUTANEOUS | Status: DC | PRN
Start: 2013-12-31 — End: 2014-01-03

## 2013-12-31 MED ORDER — KETOROLAC TROMETHAMINE 30 MG/ML IJ SOLN
30.0000 mg | Freq: Four times a day (QID) | INTRAMUSCULAR | Status: AC | PRN
  Administered 2013-12-31: 30 mg via INTRAMUSCULAR

## 2013-12-31 MED ORDER — SIMETHICONE 80 MG PO CHEW
80.0000 mg | CHEWABLE_TABLET | ORAL | Status: DC
  Administered 2014-01-01 – 2014-01-02 (×3): 80 mg via ORAL
  Filled 2013-12-31 (×3): qty 1

## 2013-12-31 MED ORDER — MENTHOL 3 MG MT LOZG: 1.0000 | LOZENGE | OROMUCOSAL | Status: DC | PRN

## 2013-12-31 MED ORDER — MEPERIDINE HCL 25 MG/ML IJ SOLN: 6.2500 mg | INTRAMUSCULAR | Status: DC | PRN

## 2013-12-31 MED ORDER — NALBUPHINE HCL 10 MG/ML IJ SOLN: 5.0000 mg | INTRAMUSCULAR | Status: DC | PRN

## 2013-12-31 MED ORDER — 0.9 % SODIUM CHLORIDE (POUR BTL) OPTIME
TOPICAL | Status: DC | PRN
  Administered 2013-12-31: 1000 mL

## 2013-12-31 MED ORDER — OXYTOCIN 10 UNIT/ML IJ SOLN
40.0000 [IU] | INTRAMUSCULAR | Status: DC | PRN
  Administered 2013-12-31: 40 [IU] via INTRAVENOUS

## 2013-12-31 MED ORDER — PRENATAL MULTIVITAMIN CH
1.0000 | ORAL_TABLET | Freq: Every day | ORAL | Status: DC
  Administered 2014-01-01 – 2014-01-02 (×2): 1 via ORAL
  Filled 2013-12-31 (×2): qty 1

## 2013-12-31 MED ORDER — IBUPROFEN 600 MG PO TABS
600.0000 mg | ORAL_TABLET | Freq: Four times a day (QID) | ORAL | Status: DC
  Administered 2014-01-01 – 2014-01-03 (×11): 600 mg via ORAL
  Filled 2013-12-31 (×11): qty 1

## 2013-12-31 MED ORDER — DIPHENHYDRAMINE HCL 25 MG PO CAPS: 25.0000 mg | ORAL_CAPSULE | Freq: Four times a day (QID) | ORAL | Status: DC | PRN

## 2013-12-31 MED ORDER — BUTORPHANOL TARTRATE 1 MG/ML IJ SOLN
1.0000 mg | INTRAMUSCULAR | Status: DC | PRN
  Administered 2013-12-31: 1 mg via INTRAVENOUS
  Filled 2013-12-31: qty 1

## 2013-12-31 MED ORDER — FENTANYL CITRATE 0.05 MG/ML IJ SOLN: 25.0000 ug | INTRAMUSCULAR | Status: DC | PRN

## 2013-12-31 MED ORDER — EPHEDRINE 5 MG/ML INJ: 10.0000 mg | INTRAVENOUS | Status: DC | PRN

## 2013-12-31 MED ORDER — CEFAZOLIN SODIUM-DEXTROSE 2-3 GM-% IV SOLR
INTRAVENOUS | Status: AC
  Filled 2013-12-31: qty 50

## 2013-12-31 MED ORDER — PHENYLEPHRINE 40 MCG/ML (10ML) SYRINGE FOR IV PUSH (FOR BLOOD PRESSURE SUPPORT): 80.0000 ug | PREFILLED_SYRINGE | INTRAVENOUS | Status: DC | PRN

## 2013-12-31 MED ORDER — METOCLOPRAMIDE HCL 5 MG/ML IJ SOLN: 10.0000 mg | Freq: Three times a day (TID) | INTRAMUSCULAR | Status: DC | PRN

## 2013-12-31 MED ORDER — WITCH HAZEL-GLYCERIN EX PADS: 1.0000 "application " | MEDICATED_PAD | CUTANEOUS | Status: DC | PRN

## 2013-12-31 MED ORDER — ONDANSETRON HCL 4 MG/2ML IJ SOLN
INTRAMUSCULAR | Status: AC
  Filled 2013-12-31: qty 4

## 2013-12-31 MED ORDER — PHENYLEPHRINE 40 MCG/ML (10ML) SYRINGE FOR IV PUSH (FOR BLOOD PRESSURE SUPPORT)
PREFILLED_SYRINGE | INTRAVENOUS | Status: AC
  Filled 2013-12-31: qty 10

## 2013-12-31 MED ORDER — TETANUS-DIPHTH-ACELL PERTUSSIS 5-2.5-18.5 LF-MCG/0.5 IM SUSP
0.5000 mL | Freq: Once | INTRAMUSCULAR | Status: AC
  Administered 2014-01-01: 0.5 mL via INTRAMUSCULAR

## 2013-12-31 MED ORDER — SCOPOLAMINE 1 MG/3DAYS TD PT72
1.0000 | MEDICATED_PATCH | Freq: Once | TRANSDERMAL | Status: DC
  Administered 2013-12-31: 1.5 mg via TRANSDERMAL

## 2013-12-31 MED ORDER — FENTANYL CITRATE 0.05 MG/ML IJ SOLN
INTRAMUSCULAR | Status: AC
Start: 2013-12-31 — End: 2013-12-31
  Filled 2013-12-31: qty 2

## 2013-12-31 MED ORDER — ONDANSETRON HCL 4 MG/2ML IJ SOLN: 4.0000 mg | Freq: Three times a day (TID) | INTRAMUSCULAR | Status: DC | PRN

## 2013-12-31 MED ORDER — NALOXONE HCL 0.4 MG/ML IJ SOLN: 0.4000 mg | INTRAMUSCULAR | Status: DC | PRN

## 2013-12-31 MED ORDER — NALOXONE HCL 1 MG/ML IJ SOLN: 1.0000 ug/kg/h | INTRAVENOUS | Status: DC | PRN

## 2013-12-31 MED ORDER — ONDANSETRON HCL 4 MG/2ML IJ SOLN: 4.0000 mg | INTRAMUSCULAR | Status: DC | PRN

## 2013-12-31 MED ORDER — NALBUPHINE HCL 10 MG/ML IJ SOLN
5.0000 mg | INTRAMUSCULAR | Status: DC | PRN
  Administered 2014-01-01: 10 mg via INTRAVENOUS
  Filled 2013-12-31: qty 1

## 2013-12-31 MED ORDER — DIPHENHYDRAMINE HCL 50 MG/ML IJ SOLN: 25.0000 mg | INTRAMUSCULAR | Status: DC | PRN

## 2013-12-31 SURGICAL SUPPLY — 35 items
BARRIER ADHS 3X4 INTERCEED (GAUZE/BANDAGES/DRESSINGS) IMPLANT
BENZOIN TINCTURE PRP APPL 2/3 (GAUZE/BANDAGES/DRESSINGS) ×3 IMPLANT
CLAMP CORD UMBIL (MISCELLANEOUS) IMPLANT
CLOSURE WOUND 1/2 X4 (GAUZE/BANDAGES/DRESSINGS) ×1
CLOTH BEACON ORANGE TIMEOUT ST (SAFETY) ×3 IMPLANT
CONTAINER PREFILL 10% NBF 15ML (MISCELLANEOUS) IMPLANT
DRAPE LG THREE QUARTER DISP (DRAPES) IMPLANT
DRSG OPSITE 6X11 MED (GAUZE/BANDAGES/DRESSINGS) ×3 IMPLANT
DRSG OPSITE POSTOP 4X10 (GAUZE/BANDAGES/DRESSINGS) ×3 IMPLANT
DURAPREP 26ML APPLICATOR (WOUND CARE) ×3 IMPLANT
ELECT REM PT RETURN 9FT ADLT (ELECTROSURGICAL) ×3
ELECTRODE REM PT RTRN 9FT ADLT (ELECTROSURGICAL) ×1 IMPLANT
EXTRACTOR VACUUM M CUP 4 TUBE (SUCTIONS) IMPLANT
EXTRACTOR VACUUM M CUP 4' TUBE (SUCTIONS)
GLOVE BIO SURGEON STRL SZ 6.5 (GLOVE) ×2 IMPLANT
GLOVE BIO SURGEONS STRL SZ 6.5 (GLOVE) ×1
GOWN STRL REUS W/TWL LRG LVL3 (GOWN DISPOSABLE) ×6 IMPLANT
KIT ABG SYR 3ML LUER SLIP (SYRINGE) IMPLANT
NEEDLE HYPO 22GX1.5 SAFETY (NEEDLE) IMPLANT
NEEDLE HYPO 25X5/8 SAFETYGLIDE (NEEDLE) ×3 IMPLANT
NS IRRIG 1000ML POUR BTL (IV SOLUTION) ×3 IMPLANT
PACK C SECTION WH (CUSTOM PROCEDURE TRAY) ×3 IMPLANT
PAD OB MATERNITY 4.3X12.25 (PERSONAL CARE ITEMS) ×3 IMPLANT
STAPLER VISISTAT 35W (STAPLE) IMPLANT
STRIP CLOSURE SKIN 1/2X4 (GAUZE/BANDAGES/DRESSINGS) ×2 IMPLANT
SUT CHROMIC 0 CTX 36 (SUTURE) ×6 IMPLANT
SUT PLAIN 0 NONE (SUTURE) IMPLANT
SUT PLAIN 2 0 XLH (SUTURE) IMPLANT
SUT VIC AB 0 CT1 27 (SUTURE) ×8
SUT VIC AB 0 CT1 27XBRD ANBCTR (SUTURE) ×4 IMPLANT
SUT VIC AB 4-0 KS 27 (SUTURE) IMPLANT
SYR CONTROL 10ML LL (SYRINGE) IMPLANT
TOWEL OR 17X24 6PK STRL BLUE (TOWEL DISPOSABLE) ×3 IMPLANT
TRAY FOLEY CATH 14FR (SET/KITS/TRAYS/PACK) ×3 IMPLANT
WATER STERILE IRR 1000ML POUR (IV SOLUTION) ×3 IMPLANT

## 2013-12-31 NOTE — H&P (Signed)
Bianca Jones is 38 year old G 1 P 0 at 40 w 3 days admitted last night for induction of labor for postdates. She is status post 2 cytotec and is now status post epidural.. Maternal Medical History:  Prenatal complications: no prenatal complications   OB History   Grav Para Term Preterm Abortions TAB SAB Ect Mult Living   1              Past Medical History  Diagnosis Date  . AMA (advanced maternal age) multigravida 35+   . Vaginal Pap smear, abnormal   . Herpes     rare outbreaks, was told by prev MD she had +HSV cultures  . Stroke     mild age 52  . Asthma   . Narcolepsy   . Multiple thyroid nodules   . H/O hiatal hernia   . IBS (irritable bowel syndrome)    Past Surgical History  Procedure Laterality Date  . Eye surgery    . Breast surgery      benign breast cystectomy   Family History: family history includes Anxiety disorder in her mother; Cancer in her maternal aunt; Depression in her mother; Heart attack in her maternal grandfather and paternal grandfather; Hypertension in her father and mother; Rheum arthritis in her father, maternal aunt, and mother. Social History:  reports that she has never smoked. She has never used smokeless tobacco. She reports that she does not drink alcohol or use illicit drugs.   Prenatal Transfer Tool  Maternal Diabetes: No Genetic Screening: Normal Maternal Ultrasounds/Referrals: Normal Fetal Ultrasounds or other Referrals:  None Maternal Substance Abuse:  No Significant Maternal Medications:  None Significant Maternal Lab Results:  None Other Comments:  None  Review of Systems  All other systems reviewed and are negative.   Dilation: 2.5 Effacement (%): 90 Station: -1 Exam by:: Dr Lovena Le Blood pressure 104/68, pulse 108, temperature 98 F (36.7 C), temperature source Oral, resp. rate 20, height 5\' 7"  (1.702 m), weight 88.451 kg (195 lb), last menstrual period 03/23/2013, SpO2 100.00%. Maternal Exam:  Uterine Assessment:  Contraction strength is moderate.  Contraction frequency is regular.   Abdomen: Fetal presentation: vertex     Fetal Exam Fetal State Assessment: Category I - tracings are normal.     Physical Exam  Nursing note and vitals reviewed. Constitutional: She appears well-developed.  HENT:  Head: Normocephalic.  Eyes: Pupils are equal, round, and reactive to light.  Neck: Normal range of motion.  Cardiovascular: Normal rate.   Respiratory: Effort normal.  GI: Soft.    Prenatal labs: ABO, Rh: --/--/O POS (06/01 1940) Antibody: PENDING (06/01 1940) Rubella: Immune (10/28 0000) RPR: NON REAC (06/01 1940)  HBsAg: Negative (10/28 0000)  HIV: Non-reactive (10/28 0000)  GBS: Negative (04/28 0000)   Assessment/Plan: IUP at 40 w 3 days Post dates Follow labor curve   Jeani Hawking 12/31/2013, 8:37 AM

## 2013-12-31 NOTE — Brief Op Note (Signed)
12/30/2013 - 12/31/2013  5:54 PM  PATIENT:  Bianca Jones  38 y.o. female  PRE-OPERATIVE DIAGNOSIS:  Primary Cesarean Section for failure to progress  POST-OPERATIVE DIAGNOSIS:  Primary Cesarean section for failure to progress  PROCEDURE:  Procedure(s): CESAREAN SECTION (N/A)  SURGEON:  Surgeon(s) and Role:    * Jeani Hawking, MD - Primary  PHYSICIAN ASSISTANT:   ASSISTANTS: none   ANESTHESIA:   epidural  EBL:  Total I/O In: 1300 [I.V.:1300] Out: 850 [Urine:350; Blood:500]  BLOOD ADMINISTERED:none  DRAINS: Urinary Catheter (Foley)   LOCAL MEDICATIONS USED:  NONE  SPECIMEN:  No Specimen  DISPOSITION OF SPECIMEN:  N/A  COUNTS:  YES  TOURNIQUET:  * No tourniquets in log *  DICTATION: .Other Dictation: Dictation Number dictated  PLAN OF CARE: Admit to inpatient   PATIENT DISPOSITION:  PACU - hemodynamically stable.   Delay start of Pharmacological VTE agent (>24hrs) due to surgical blood loss or risk of bleeding: not applicable

## 2013-12-31 NOTE — Transfer of Care (Signed)
Immediate Anesthesia Transfer of Care Note  Patient: Bianca Jones  Procedure(s) Performed: Procedure(s): CESAREAN SECTION (N/A)  Patient Location: PACU  Anesthesia Type:Epidural  Level of Consciousness: awake, alert  and oriented  Airway & Oxygen Therapy: Patient Spontanous Breathing  Post-op Assessment: Report given to PACU RN and Post -op Vital signs reviewed and stable  Post vital signs: Reviewed and stable  Complications: No apparent anesthesia complications

## 2013-12-31 NOTE — Anesthesia Procedure Notes (Signed)
Epidural Patient location during procedure: OB Start time: 12/31/2013 8:05 AM End time: 12/31/2013 8:11 AM  Staffing Anesthesiologist: Leilani Able Performed by: anesthesiologist   Preanesthetic Checklist Completed: patient identified, surgical consent, pre-op evaluation, timeout performed, IV checked, risks and benefits discussed and monitors and equipment checked  Epidural Patient position: sitting Prep: site prepped and draped and DuraPrep Patient monitoring: continuous pulse ox and blood pressure Approach: midline Location: L3-L4 Injection technique: LOR air  Needle:  Needle type: Tuohy  Needle gauge: 17 G Needle length: 9 cm and 9 Needle insertion depth: 6 cm Catheter type: closed end flexible Catheter size: 19 Gauge Catheter at skin depth: 11 cm Test dose: negative and Other  Assessment Sensory level: T9 Events: blood not aspirated, injection not painful, no injection resistance, negative IV test and no paresthesia  Additional Notes Reason for block:procedure for pain

## 2013-12-31 NOTE — Progress Notes (Signed)
Patient in good labor pattern all day FHR is category 1 Exam at 1:30 cervix 4 cm dilated and cervix swollen  IUPC placed and adequate [place] units noted Exam by nurse no change in dilation and cervix more swollen Discussed with patient no progression this afternoon Patient and her spouse desire C Section Risks reviewed Consent signed.

## 2013-12-31 NOTE — Anesthesia Postprocedure Evaluation (Signed)
  Anesthesia Post-op Note  Patient: Bianca Jones  Procedure(s) Performed: Procedure(s): CESAREAN SECTION (N/A)  Patient Location: PACU  Anesthesia Type:Epidural  Level of Consciousness: awake, alert  and oriented  Airway and Oxygen Therapy: Patient Spontanous Breathing  Post-op Pain: none  Post-op Assessment: Post-op Vital signs reviewed, Patient's Cardiovascular Status Stable, Respiratory Function Stable, Patent Airway, No signs of Nausea or vomiting, Pain level controlled, No headache and No backache  Post-op Vital Signs: Reviewed and stable  Last Vitals:  Filed Vitals:   12/31/13 1845  BP: 111/55  Pulse: 103  Temp:   Resp: 21    Complications: No apparent anesthesia complications

## 2013-12-31 NOTE — Anesthesia Preprocedure Evaluation (Addendum)
Anesthesia Evaluation  Patient identified by MRN, date of birth, ID band Patient awake    Reviewed: Allergy & Precautions, H&P , NPO status , Patient's Chart, lab work & pertinent test results  Airway Mallampati: II TM Distance: >3 FB Neck ROM: full    Dental no notable dental hx. (+) Teeth Intact   Pulmonary asthma ,  breath sounds clear to auscultation  Pulmonary exam normal       Cardiovascular negative cardio ROS  Rhythm:Regular Rate:Normal     Neuro/Psych  Headaches, PSYCHIATRIC DISORDERS Depression CVA, No Residual Symptoms    GI/Hepatic Neg liver ROS, hiatal hernia, GERD-  Medicated and Controlled,IBS   Endo/Other  negative endocrine ROS  Renal/GU negative Renal ROS  negative genitourinary   Musculoskeletal negative musculoskeletal ROS (+)   Abdominal Normal abdominal exam  (+)   Peds  Hematology  (+) anemia ,   Anesthesia Other Findings   Reproductive/Obstetrics (+) Pregnancy                          Anesthesia Physical Anesthesia Plan  ASA: II and emergent  Anesthesia Plan: Epidural   Post-op Pain Management:    Induction:   Airway Management Planned: Natural Airway  Additional Equipment:   Intra-op Plan:   Post-operative Plan:   Informed Consent: I have reviewed the patients History and Physical, chart, labs and discussed the procedure including the risks, benefits and alternatives for the proposed anesthesia with the patient or authorized representative who has indicated his/her understanding and acceptance.     Plan Discussed with: Anesthesiologist, CRNA and Surgeon  Anesthesia Plan Comments: (Patient for C/Section for failure to progress and failed induction. Will use epidural for C/Section. )     Anesthesia Quick Evaluation

## 2014-01-01 ENCOUNTER — Encounter (HOSPITAL_COMMUNITY): Payer: Self-pay | Admitting: Obstetrics and Gynecology

## 2014-01-01 LAB — CBC
HEMATOCRIT: 31.5 % — AB (ref 36.0–46.0)
HEMOGLOBIN: 10.4 g/dL — AB (ref 12.0–15.0)
MCH: 29.2 pg (ref 26.0–34.0)
MCHC: 33 g/dL (ref 30.0–36.0)
MCV: 88.5 fL (ref 78.0–100.0)
Platelets: 227 10*3/uL (ref 150–400)
RBC: 3.56 MIL/uL — ABNORMAL LOW (ref 3.87–5.11)
RDW: 13 % (ref 11.5–15.5)
WBC: 20.6 10*3/uL — ABNORMAL HIGH (ref 4.0–10.5)

## 2014-01-01 LAB — BIRTH TISSUE RECOVERY COLLECTION (PLACENTA DONATION)

## 2014-01-01 NOTE — Anesthesia Postprocedure Evaluation (Signed)
Anesthesia Post Note  Patient: Bianca Jones  Procedure(s) Performed: Procedure(s) (LRB): CESAREAN SECTION (N/A)  Anesthesia type: Epidural  Patient location: Mother/Baby  Post pain: Pain level controlled  Post assessment: Post-op Vital signs reviewed  Last Vitals:  Filed Vitals:   01/01/14 0448  BP: 111/66  Pulse: 93  Temp:   Resp:     Post vital signs: Reviewed  Level of consciousness:alert  Complications: No apparent anesthesia complications

## 2014-01-01 NOTE — Addendum Note (Signed)
Addendum created 01/01/14 0759 by Graciela Husbands, CRNA   Modules edited: Charges VN, Notes Section   Notes Section:  File: [id number]

## 2014-01-01 NOTE — Progress Notes (Signed)
Subjective: Postpartum Day 1: Cesarean Delivery Patient reports tolerating PO.    Objective: Vital signs in last 24 hours: Temp:  [97.6 F (36.4 C)-99.8 F (37.7 C)] 98.5 F (36.9 C) (06/03 0445) Pulse Rate:  [72-108] 93 (06/03 0448) Resp:  [16-28] 18 (06/03 0445) BP: (91-135)/(44-119) 111/66 mmHg (06/03 0448) SpO2:  [94 %-100 %] 97 % (06/03 0445)  Physical Exam:  General: alert and cooperative Lochia: appropriate Uterine Fundus: firm Incision: healing well DVT Evaluation: No evidence of DVT seen on physical exam. Negative Homan's sign. No cords or calf tenderness.   Recent Labs  12/30/13 1940 01/01/14 0555  HGB 11.5* 10.4*  HCT 33.8* 31.5*    Assessment/Plan: Status post Cesarean section. Doing well postoperatively.  Continue current care.  Judith Blonder 01/01/2014, 8:08 AM

## 2014-01-01 NOTE — Op Note (Signed)
NAME:  Bianca Jones, Bianca Jones             ACCOUNT NO.:  [id number]  MEDICAL RECORD NO.:  [id number]  LOCATION:  9126                          FACILITY:  [facility]  PHYSICIAN:  Demarus Latterell L. Liborio Saccente, M.D.DATE OF BIRTH:  [birth year]  DATE OF PROCEDURE:  12/31/2013 DATE OF DISCHARGE:                              OPERATIVE REPORT   PREOPERATIVE DIAGNOSES:  Intrauterine pregnancy at 40 and 3, and failed induction.  POSTOPERATIVE DIAGNOSES:  Intrauterine pregnancy at 40 and 3, and failed induction.  PROCEDURE:  Primary low-transverse cesarean section.  SURGEON:  Mavis Gravelle L. Delynn Pursley, M.D.  EBL:  500 mL.  COMPLICATIONS:  None.  DRAINS:  Foley.  PROCEDURE:  The patient was taken to the operating room.  Her epidural was dosed and found to be adequate.  She was then prepped and draped in usual sterile fashion.  A low-transverse incision was made, carried down to the fascia.  Fascia was scored in the midline, extended laterally. Rectus muscles were separated in midline.  The peritoneum was entered bluntly.  The peritoneal incision was then stretched.  The bladder blade was then inserted.  The lower uterine segment was identified.  The bladder flap was then created sharply and then digitally.  The bladder blade was then readjusted.  A low-transverse incision was made using a scalpel.  A hemostat was used to enter the uterine cavity.  Amniotic fluid was clear.  The baby was in cephalic presentation, delivered easily with 1 gentle pull of the vacuum, was a female infant, Apgars 9 at one minute and 9 at five minutes.  The cord was clamped and cut.  The baby was handed to the awaiting pediatricians.  The placenta was manually removed, noted to be normal intact with a three-vessel cord. The uterus was exteriorized and cleared of all clots and debris.  The uterine incision was closed in 3 layers using 0 chromic in a running, locked stitch.  After irrigation, the uterine incision was noted to be normal and  hemostatic.  The peritoneum was closed using 0 Vicryl.  The fascia was closed using 0 Vicryl starting each corner meeting in the midline.  After irrigation of subcutaneous layer, the skin was closed with a 4-0 Vicryl on a Keith needle.  Benzoin and Steri-Strips were applied.  All sponge, lap, and instrument counts were correct x2.  The patient went to recovery room in stable condition.    Rhydian Baldi L. Vincente Poli, M.D.    Florestine Avers  D:  12/31/2013  T:  01/01/2014  Job:  [id number]

## 2014-01-01 NOTE — Lactation Note (Signed)
This note was copied from the chart of Bianca Jesse Yanni. Lactation Consultation Note  Initial consult:  Mother's nipples are both bruised.  Nipples evert with stimulation.  Reviewed hand expression. Baby has a distinct labial frenulum.  Upper lip flanged when latching. Assisted in placing baby in fb hold.  Mother prepumped with hand pump to evert nipple. With compression baby latched but would not sustain latch.  Baby had a few sucks and then unlatched. Baby sleepy at the breast. Encouraged mother to wear shells and do STS and retry when baby demonstrates feeding cues.   Reviewed basics and Mom made aware of O/P services, breastfeeding support groups, community resources, and our phone # for post-discharge questions.     Patient Name: Bianca Jones Today's Date: 01/01/2014 Reason for consult: Initial assessment   Maternal Data Has patient been taught Hand Expression?: Yes Does the patient have breastfeeding experience prior to this delivery?: No  Feeding    LATCH Score/Interventions                      Lactation Tools Discussed/Used     Consult Status Consult Status: Follow-up Date: 01/02/14 Follow-up type: In-patient    Dulce Sellar Berkelhammer 01/01/2014, 11:54 AM

## 2014-01-02 NOTE — Progress Notes (Signed)
Subjective: Postpartum Day 2: Cesarean Delivery Patient reports tolerating PO, + flatus and no problems voiding.    Objective: Vital signs in last 24 hours: Temp:  [98 F (36.7 C)-99 F (37.2 C)] 98.1 F (36.7 C) (06/04 0545) Pulse Rate:  [89-114] 114 (06/04 0545) Resp:  [18] 18 (06/04 0545) BP: (87-113)/(56-77) 113/77 mmHg (06/04 0545) SpO2:  [98 %-99 %] 99 % (06/03 1705)  Physical Exam:  General: alert and cooperative Lochia: appropriate Uterine Fundus: firm Incision: healing well DVT Evaluation: No evidence of DVT seen on physical exam. Negative Homan's sign. No cords or calf tenderness. No significant calf/ankle edema.   Recent Labs  12/30/13 1940 01/01/14 0555  HGB 11.5* 10.4*  HCT 33.8* 31.5*    Assessment/Plan: Status post Cesarean section. Doing well postoperatively.  Continue current care.  Judith Blonder 01/02/2014, 8:14 AM

## 2014-01-02 NOTE — Lactation Note (Signed)
This note was copied from the chart of Bianca Jones. Lactation Consultation Note  Patient Name: Bianca Jones [id number] Date: 01/02/2014 Reason for consult: Follow-up assessment Per mom baby feeds well on the left breast , longest latch 15 mins without releasing.  The right is a different story ,. On and off. LC assessed baby's palate and mouth , noted excellent tongue mobility ,  Noted a narrow high palate , and due to moms areola edema , moms tissue isn't tickling baby's roof of the mouth consistently, So baby has been feeding on and off. Sized for #20 and #24 , #24 NS fits better and baby latched right on and fed for consistent 10 -12 mins. With several swallows noted and colostrum in the nipple shield. Baby sustained latch well with depth. Baby fell asleep and mom released  suction , nipple appeared normal in nipple shield. Baby woke up still hungry,and latched on the left without the nipple shield and multiply swallows  Noted , increased with breast compressions . LC reviewed plan of care - shells between feedings , prior to latch breast massage , hand express, prepump To make the nipple and areola more elastic, and apply the #24 Nipple shield , latch with firm support,and feed, post pump for 10 -15 mins and save milk to instill in the top of The nipple shield. Mom receptive to teaching and [facility] plan. MBU RN Truddie Hidden aware of LC plan.    Maternal Data Formula Feeding for Exclusion: No Has patient been taught Hand Expression?: Yes  Feeding Feeding Type: Breast Fed (applied #24 NS after sizing with #20 NS and #24 better fit ) Length of feed: 10 min (with swallows )  LATCH Score/Interventions Latch: Grasps breast easily, tongue down, lips flanged, rhythmical sucking. Intervention(s): Adjust position;Assist with latch;Breast massage;Breast compression  Audible Swallowing: A few with stimulation  Type of Nipple: Everted at rest and after stimulation (semi compressible  areolas , and areola edema )  Comfort (Breast/Nipple): Soft / non-tender     Hold (Positioning): Assistance needed to correctly position infant at breast and maintain latch. Intervention(s): Breastfeeding basics reviewed;Support Pillows;Position options;Skin to skin  LATCH Score: 8  Lactation Tools Discussed/Used Tools: Nipple Shields Nipple shield size: 24;Other (comment) (sized for both #20 abd #24 NS , #24 NS better fit ) Shell Type: Inverted Breast pump type: Double-Electric Breast Pump   Consult Status Consult Status: Follow-up Date: 01/03/14 Follow-up type: In-patient    Bianca Jones 01/02/2014, 3:57 PM

## 2014-01-02 NOTE — Progress Notes (Signed)
Patient was referred for history of depression/anxiety.  * Referral screened out by Clinical Social Worker because none of the following criteria appear to apply:  ~ History of anxiety/depression during this pregnancy, or of post-partum depression.  ~ Diagnosis of anxiety and/or depression within last 4 years, per chart review.  ~ History of depression due to pregnancy loss/loss of child  OR  * Patient's symptoms currently being treated with medication and/or therapy.  Please contact the Clinical Social Worker if needs arise, or by the patient's request.   

## 2014-01-03 MED ORDER — PNEUMOCOCCAL VAC POLYVALENT 25 MCG/0.5ML IJ INJ
0.5000 mL | INJECTION | Freq: Once | INTRAMUSCULAR | Status: AC
  Administered 2014-01-03: 0.5 mL via INTRAMUSCULAR
  Filled 2014-01-03: qty 0.5

## 2014-01-03 MED ORDER — PRENATAL MULTIVITAMIN CH: 1.0000 | ORAL_TABLET | Freq: Every day | ORAL | Status: AC

## 2014-01-03 MED ORDER — OXYCODONE-ACETAMINOPHEN 5-325 MG PO TABS: 1.0000 | ORAL_TABLET | ORAL | Status: DC | PRN

## 2014-01-03 MED ORDER — IBUPROFEN 600 MG PO TABS: 600.0000 mg | ORAL_TABLET | Freq: Four times a day (QID) | ORAL | Status: DC

## 2014-01-03 NOTE — Discharge Summary (Signed)
Obstetric Discharge Summary Reason for Admission: induction of labor Prenatal Procedures: ultrasound Intrapartum Procedures: cesarean: low cervical, transverse Postpartum Procedures: none Complications-Operative and Postpartum: none Hemoglobin  Date Value Ref Range Status  01/01/2014 10.4* 12.0 - 15.0 g/dL Final     HCT  Date Value Ref Range Status  01/01/2014 31.5* 36.0 - 46.0 % Final    Physical Exam:  General: alert and cooperative Lochia: appropriate Uterine Fundus: firm Incision: healing well DVT Evaluation: No evidence of DVT seen on physical exam. Negative Homan's sign. No cords or calf tenderness. No significant calf/ankle edema.  Discharge Diagnoses: Term Pregnancy-delivered  Discharge Information: Date: 01/03/2014 Activity: pelvic rest Diet: routine Medications: PNV, Ibuprofen and Percocet Condition: stable Instructions: refer to practice specific booklet Discharge to: home   Newborn Data: Live born female  Birth Weight: 7 lb 0.5 oz (3190 g) APGAR: 9, 9  Home with mother.  Bianca Jones 01/03/2014, 8:23 AM

## 2014-01-03 NOTE — Lactation Note (Signed)
This note was copied from the chart of Bianca Eleesha Fedorchak. Lactation Consultation Note  Follow up consult:  Mother states breastfeeding improved.  Using NS on right only, prepumping, seeing colostrum. Last feeding 0700 35 min. Mother feels comfortable, has ordered DEBP for home. Reviewed engorgement care and cluster feeding.  Encouraged her to call with next feeding to view latch.    Patient Name: Bianca Jones [id number] Date: 01/03/2014 Reason for consult: Follow-up assessment   Maternal Data    Feeding Feeding Type: Breast Fed  LATCH Score/Interventions                      Lactation Tools Discussed/Used     Consult Status Consult Status: PRN    Dulce Sellar Keshawn Fiorito 01/03/2014, 10:14 AM

## 2014-06-02 ENCOUNTER — Encounter (HOSPITAL_COMMUNITY): Payer: Self-pay | Admitting: Obstetrics and Gynecology

## 2015-11-05 LAB — OB RESULTS CONSOLE HIV ANTIBODY (ROUTINE TESTING): HIV: NONREACTIVE

## 2015-11-05 LAB — OB RESULTS CONSOLE RPR: RPR: NONREACTIVE

## 2015-11-05 LAB — OB RESULTS CONSOLE ABO/RH: RH TYPE: POSITIVE

## 2015-11-05 LAB — OB RESULTS CONSOLE RUBELLA ANTIBODY, IGM: RUBELLA: IMMUNE

## 2015-11-05 LAB — OB RESULTS CONSOLE GC/CHLAMYDIA
CHLAMYDIA, DNA PROBE: NEGATIVE
Gonorrhea: NEGATIVE

## 2015-11-05 LAB — OB RESULTS CONSOLE ANTIBODY SCREEN: Antibody Screen: NEGATIVE

## 2015-11-05 LAB — OB RESULTS CONSOLE HEPATITIS B SURFACE ANTIGEN: HEP B S AG: NEGATIVE

## 2016-05-17 LAB — OB RESULTS CONSOLE GBS: GBS: NEGATIVE

## 2016-05-26 NOTE — H&P (Addendum)
Bianca SpellerChrista B Jones is a 40 y.o. female presenting for repeat Jones/S at 39 weeks.  Preg complicated by AMA with normal NIPT. Prev Jones/S desires repeat. OB History    Gravida Para Term Preterm AB Living   1 1 1     1    SAB TAB Ectopic Multiple Live Births           1     Past Medical History:  Diagnosis Date  . AMA (advanced maternal age) multigravida 35+   . Asthma   . H/O hiatal hernia   . Herpes    rare outbreaks, was told by prev MD she had +HSV cultures  . IBS (irritable bowel syndrome)   . Multiple thyroid nodules   . Narcolepsy   . Stroke    mild age 40  . Vaginal Pap smear, abnormal    Past Surgical History:  Procedure Laterality Date  . BREAST SURGERY     benign breast cystectomy  . CESAREAN SECTION N/A 12/31/2013   Procedure: CESAREAN SECTION;  Surgeon: Jeani HawkingMichelle L Grewal, MD;  Location: [facility];  Service: Obstetrics;  Laterality: N/A;  . EYE SURGERY     Family History: family history includes Anxiety disorder in her mother; Cancer in her maternal aunt; Depression in her mother; Heart attack in her maternal grandfather and paternal grandfather; Hypertension in her father and mother; Rheum arthritis in her father, maternal aunt, and mother. Social History:  reports that she has never smoked. She has never used smokeless tobacco. She reports that she does not drink alcohol or use drugs.     Maternal Diabetes: No Genetic Screening: Normal Maternal Ultrasounds/Referrals: Normal Fetal Ultrasounds or other Referrals:  None Maternal Substance Abuse:  No Significant Maternal Medications:  None Significant Maternal Lab Results:  None Other Comments:  None  ROS History   unknown if currently breastfeeding. Exam Physical Exam  Prenatal labs: ABO, Rh:   Antibody:   Rubella:   RPR:    HBsAg:    HIV:    GBS:     Assessment/Plan: IUP at 39 weeks Prev Jones/S desires repeat. Risks and benefits of Jones/S were discussed.  All questions were answered and informed consent was  obtained.  Plan to proceed with low segment transverse Cesarean Section.  This patient has been seen and examined.   All of her questions were answered.  Labs and vital signs reviewed.  Informed consent has been obtained.  The History and Physical is current.  Bianca Jones 05/26/2016, 9:04 AM

## 2016-05-30 ENCOUNTER — Encounter (HOSPITAL_COMMUNITY): Payer: Self-pay | Admitting: *Deleted

## 2016-05-30 ENCOUNTER — Telehealth (HOSPITAL_COMMUNITY): Payer: Self-pay | Admitting: *Deleted

## 2016-05-30 NOTE — Telephone Encounter (Signed)
Preadmission screen  

## 2016-05-31 ENCOUNTER — Encounter (HOSPITAL_COMMUNITY): Payer: Self-pay

## 2016-05-31 ENCOUNTER — Telehealth (HOSPITAL_COMMUNITY): Payer: Self-pay | Admitting: *Deleted

## 2016-05-31 NOTE — Telephone Encounter (Signed)
Preadmission screen  

## 2016-06-08 ENCOUNTER — Encounter (HOSPITAL_COMMUNITY)
Admission: RE | Admit: 2016-06-08 | Discharge: 2016-06-08 | Disposition: A | Discharge status: Home / Self Care | Payer: BC Managed Care – PPO | Source: Ambulatory Visit | Attending: Obstetrics and Gynecology | Admitting: Obstetrics and Gynecology

## 2016-06-08 LAB — CBC
HCT: 35.9 % — ABNORMAL LOW (ref 36.0–46.0)
Hemoglobin: 12.2 g/dL (ref 12.0–15.0)
MCH: 29.9 pg (ref 26.0–34.0)
MCHC: 34 g/dL (ref 30.0–36.0)
MCV: 88 fL (ref 78.0–100.0)
PLATELETS: 248 10*3/uL (ref 150–400)
RBC: 4.08 MIL/uL (ref 3.87–5.11)
RDW: 13 % (ref 11.5–15.5)
WBC: 12.6 10*3/uL — AB (ref 4.0–10.5)

## 2016-06-08 LAB — TYPE AND SCREEN
ABO/RH(D): O POS
ANTIBODY SCREEN: NEGATIVE

## 2016-06-08 MED ORDER — DEXTROSE 5 % IV SOLN
2.0000 g | INTRAVENOUS | Status: AC
  Administered 2016-06-09: 2 g via INTRAVENOUS
  Filled 2016-06-08: qty 2

## 2016-06-08 NOTE — Patient Instructions (Signed)
20 Bianca Jones  06/08/2016   Your procedure is scheduled on:  06/09/2016  [facility] at 0600 AM.  Pick up the phone at the desk and dial [phone].   Call this number if you have problems the morning of surgery: [phone]   Remember:   Do not eat food:After Midnight.  Do not drink clear liquids: After Midnight.  Take these medicines the morning of surgery with A SIP OF WATER: May take zantac if you take it in the mornings.   Do not wear jewelry, make-up or nail polish.  Do not wear lotions, powders, or perfumes. Do not wear deodorant.  Do not shave 48 hours prior to surgery.  Do not bring valuables to the hospital.  [facility] is not   responsible for any belongings or valuables brought to the hospital.  Contacts, dentures or bridgework may not be worn into surgery.  Leave suitcase in the car. After surgery it may be brought to your room.  For patients admitted to the hospital, checkout time is 11:00 AM the day of              discharge.   Patients discharged the day of surgery will not be allowed to drive             home.  Name and phone number of your driver: na  Special Instructions:   N/A   Please read over the following fact sheets that you were given:   Surgical Site Infection Prevention

## 2016-06-09 ENCOUNTER — Inpatient Hospital Stay (HOSPITAL_COMMUNITY): Payer: BC Managed Care – PPO | Admitting: Anesthesiology

## 2016-06-09 ENCOUNTER — Encounter (HOSPITAL_COMMUNITY)
Admission: RE | Disposition: A | Discharge status: Home / Self Care | Payer: Self-pay | Source: Ambulatory Visit | Attending: Obstetrics and Gynecology

## 2016-06-09 ENCOUNTER — Encounter (HOSPITAL_COMMUNITY): Payer: Self-pay

## 2016-06-09 ENCOUNTER — Inpatient Hospital Stay (HOSPITAL_COMMUNITY)
Admission: RE | Admit: 2016-06-09 | Discharge: 2016-06-12 | DRG: 766 | Disposition: A | Discharge status: Home / Self Care | Payer: BC Managed Care – PPO | Source: Ambulatory Visit | Attending: Obstetrics and Gynecology | Admitting: Obstetrics and Gynecology

## 2016-06-09 DIAGNOSIS — J45909 Unspecified asthma, uncomplicated: Secondary | ICD-10-CM | POA: Diagnosis present

## 2016-06-09 DIAGNOSIS — Z8249 Family history of ischemic heart disease and other diseases of the circulatory system: Secondary | ICD-10-CM

## 2016-06-09 DIAGNOSIS — O328XX0 Maternal care for other malpresentation of fetus, not applicable or unspecified: Secondary | ICD-10-CM | POA: Diagnosis present

## 2016-06-09 DIAGNOSIS — Z3A39 39 weeks gestation of pregnancy: Secondary | ICD-10-CM

## 2016-06-09 DIAGNOSIS — O34211 Maternal care for low transverse scar from previous cesarean delivery: Secondary | ICD-10-CM | POA: Diagnosis present

## 2016-06-09 DIAGNOSIS — Z8673 Personal history of transient ischemic attack (TIA), and cerebral infarction without residual deficits: Secondary | ICD-10-CM | POA: Diagnosis not present

## 2016-06-09 DIAGNOSIS — O9952 Diseases of the respiratory system complicating childbirth: Secondary | ICD-10-CM | POA: Diagnosis present

## 2016-06-09 LAB — RPR: RPR: NONREACTIVE

## 2016-06-09 SURGERY — Surgical Case
Anesthesia: Spinal | Wound class: Clean Contaminated

## 2016-06-09 MED ORDER — MEPERIDINE HCL 25 MG/ML IJ SOLN: 6.2500 mg | INTRAMUSCULAR | Status: DC | PRN

## 2016-06-09 MED ORDER — OXYTOCIN 10 UNIT/ML IJ SOLN
INTRAMUSCULAR | Status: DC | PRN
  Administered 2016-06-09: 40 [IU] via INTRAMUSCULAR

## 2016-06-09 MED ORDER — ONDANSETRON HCL 4 MG/2ML IJ SOLN
INTRAMUSCULAR | Status: DC | PRN
  Administered 2016-06-09: 4 mg via INTRAVENOUS

## 2016-06-09 MED ORDER — NALBUPHINE HCL 10 MG/ML IJ SOLN: 5.0000 mg | INTRAMUSCULAR | Status: DC | PRN

## 2016-06-09 MED ORDER — ACETAMINOPHEN 500 MG PO TABS: 1000.0000 mg | ORAL_TABLET | Freq: Four times a day (QID) | ORAL | Status: DC

## 2016-06-09 MED ORDER — LACTATED RINGERS IV SOLN
Freq: Once | INTRAVENOUS | Status: AC
  Administered 2016-06-09: 1000 mL/h via INTRAVENOUS

## 2016-06-09 MED ORDER — LACTATED RINGERS IV SOLN
INTRAVENOUS | Status: DC | PRN
  Administered 2016-06-09: 08:00:00 via INTRAVENOUS

## 2016-06-09 MED ORDER — DEXAMETHASONE SODIUM PHOSPHATE 4 MG/ML IJ SOLN
INTRAMUSCULAR | Status: AC
  Filled 2016-06-09: qty 1

## 2016-06-09 MED ORDER — HYDROMORPHONE HCL 1 MG/ML IJ SOLN: 0.2500 mg | INTRAMUSCULAR | Status: DC | PRN

## 2016-06-09 MED ORDER — SIMETHICONE 80 MG PO CHEW: 80.0000 mg | CHEWABLE_TABLET | ORAL | Status: DC | PRN

## 2016-06-09 MED ORDER — SCOPOLAMINE 1 MG/3DAYS TD PT72
MEDICATED_PATCH | TRANSDERMAL | Status: AC
  Filled 2016-06-09: qty 1

## 2016-06-09 MED ORDER — DIPHENHYDRAMINE HCL 50 MG/ML IJ SOLN: 12.5000 mg | INTRAMUSCULAR | Status: DC | PRN

## 2016-06-09 MED ORDER — EPHEDRINE SULFATE 50 MG/ML IJ SOLN
INTRAMUSCULAR | Status: DC | PRN
  Administered 2016-06-09: 10 mg via INTRAVENOUS

## 2016-06-09 MED ORDER — OXYTOCIN 10 UNIT/ML IJ SOLN
INTRAMUSCULAR | Status: AC
  Filled 2016-06-09: qty 4

## 2016-06-09 MED ORDER — NALBUPHINE HCL 10 MG/ML IJ SOLN: 5.0000 mg | Freq: Once | INTRAMUSCULAR | Status: AC | PRN

## 2016-06-09 MED ORDER — PHENYLEPHRINE HCL 10 MG/ML IJ SOLN: INTRAMUSCULAR | Status: DC | PRN

## 2016-06-09 MED ORDER — OXYCODONE-ACETAMINOPHEN 5-325 MG PO TABS
1.0000 | ORAL_TABLET | ORAL | Status: DC | PRN
  Administered 2016-06-10 – 2016-06-12 (×5): 1 via ORAL
  Filled 2016-06-09 (×6): qty 1

## 2016-06-09 MED ORDER — PHENYLEPHRINE HCL 10 MG/ML IJ SOLN
INTRAMUSCULAR | Status: DC | PRN
  Administered 2016-06-09: 80 ug via INTRAVENOUS
  Administered 2016-06-09: 120 ug via INTRAVENOUS

## 2016-06-09 MED ORDER — MENTHOL 3 MG MT LOZG: 1.0000 | LOZENGE | OROMUCOSAL | Status: DC | PRN

## 2016-06-09 MED ORDER — FENTANYL CITRATE (PF) 100 MCG/2ML IJ SOLN
INTRAMUSCULAR | Status: AC
  Filled 2016-06-09: qty 2

## 2016-06-09 MED ORDER — IBUPROFEN 600 MG PO TABS
600.0000 mg | ORAL_TABLET | Freq: Four times a day (QID) | ORAL | Status: DC
  Administered 2016-06-09 – 2016-06-12 (×12): 600 mg via ORAL
  Filled 2016-06-09 (×12): qty 1

## 2016-06-09 MED ORDER — PRENATAL MULTIVITAMIN CH
1.0000 | ORAL_TABLET | Freq: Every day | ORAL | Status: DC
  Administered 2016-06-10 – 2016-06-11 (×2): 1 via ORAL
  Filled 2016-06-09 (×2): qty 1

## 2016-06-09 MED ORDER — ACETAMINOPHEN 325 MG PO TABS
650.0000 mg | ORAL_TABLET | ORAL | Status: DC | PRN
  Administered 2016-06-09 – 2016-06-10 (×3): 650 mg via ORAL
  Filled 2016-06-09 (×3): qty 2

## 2016-06-09 MED ORDER — NALOXONE HCL 0.4 MG/ML IJ SOLN: 0.4000 mg | INTRAMUSCULAR | Status: DC | PRN

## 2016-06-09 MED ORDER — NALBUPHINE HCL 10 MG/ML IJ SOLN
5.0000 mg | Freq: Once | INTRAMUSCULAR | Status: AC | PRN
  Administered 2016-06-09: 5 mg via SUBCUTANEOUS

## 2016-06-09 MED ORDER — DIPHENHYDRAMINE HCL 25 MG PO CAPS: 25.0000 mg | ORAL_CAPSULE | ORAL | Status: DC | PRN

## 2016-06-09 MED ORDER — DIPHENHYDRAMINE HCL 25 MG PO CAPS: 25.0000 mg | ORAL_CAPSULE | Freq: Four times a day (QID) | ORAL | Status: DC | PRN

## 2016-06-09 MED ORDER — SENNOSIDES-DOCUSATE SODIUM 8.6-50 MG PO TABS
2.0000 | ORAL_TABLET | ORAL | Status: DC
  Administered 2016-06-10 – 2016-06-12 (×3): 2 via ORAL
  Filled 2016-06-09 (×3): qty 2

## 2016-06-09 MED ORDER — LACTATED RINGERS IV SOLN
INTRAVENOUS | Status: DC
  Administered 2016-06-09 (×2): via INTRAVENOUS

## 2016-06-09 MED ORDER — SIMETHICONE 80 MG PO CHEW
80.0000 mg | CHEWABLE_TABLET | Freq: Three times a day (TID) | ORAL | Status: DC
  Administered 2016-06-09 – 2016-06-12 (×8): 80 mg via ORAL
  Filled 2016-06-09 (×8): qty 1

## 2016-06-09 MED ORDER — COCONUT OIL OIL
1.0000 | TOPICAL_OIL | Status: DC | PRN
Start: 2016-06-09 — End: 2016-06-12

## 2016-06-09 MED ORDER — PHENYLEPHRINE 8 MG IN D5W 100 ML (0.08MG/ML) PREMIX OPTIME
INJECTION | INTRAVENOUS | Status: AC
  Filled 2016-06-09: qty 100

## 2016-06-09 MED ORDER — OXYTOCIN 40 UNITS IN LACTATED RINGERS INFUSION - SIMPLE MED: 2.5000 [IU]/h | INTRAVENOUS | Status: AC

## 2016-06-09 MED ORDER — LACTATED RINGERS IV SOLN
INTRAVENOUS | Status: DC
Start: 2016-06-09 — End: 2016-06-12

## 2016-06-09 MED ORDER — SODIUM CHLORIDE 0.9% FLUSH: 3.0000 mL | INTRAVENOUS | Status: DC | PRN

## 2016-06-09 MED ORDER — ONDANSETRON HCL 4 MG/2ML IJ SOLN
INTRAMUSCULAR | Status: AC
Start: 2016-06-09 — End: 2016-06-09
  Filled 2016-06-09: qty 2

## 2016-06-09 MED ORDER — MORPHINE SULFATE-NACL 0.5-0.9 MG/ML-% IV SOSY
PREFILLED_SYRINGE | INTRAVENOUS | Status: AC
Start: 2016-06-09 — End: 2016-06-09
  Filled 2016-06-09: qty 1

## 2016-06-09 MED ORDER — NALOXONE HCL 2 MG/2ML IJ SOSY
1.0000 ug/kg/h | PREFILLED_SYRINGE | INTRAVENOUS | Status: DC | PRN
  Filled 2016-06-09: qty 2

## 2016-06-09 MED ORDER — ONDANSETRON HCL 4 MG/2ML IJ SOLN: 4.0000 mg | Freq: Three times a day (TID) | INTRAMUSCULAR | Status: DC | PRN

## 2016-06-09 MED ORDER — NALBUPHINE HCL 10 MG/ML IJ SOLN
INTRAMUSCULAR | Status: AC
  Administered 2016-06-09: 5 mg via SUBCUTANEOUS
  Filled 2016-06-09: qty 1

## 2016-06-09 MED ORDER — KETOROLAC TROMETHAMINE 30 MG/ML IJ SOLN
INTRAMUSCULAR | Status: AC
  Administered 2016-06-09: 30 mg
  Filled 2016-06-09: qty 1

## 2016-06-09 MED ORDER — PHENYLEPHRINE 8 MG IN D5W 100 ML (0.08MG/ML) PREMIX OPTIME
INJECTION | INTRAVENOUS | Status: DC | PRN
  Administered 2016-06-09: 60 ug/min via INTRAVENOUS

## 2016-06-09 MED ORDER — SCOPOLAMINE 1 MG/3DAYS TD PT72
1.0000 | MEDICATED_PATCH | Freq: Once | TRANSDERMAL | Status: DC
  Administered 2016-06-09: 1.5 mg via TRANSDERMAL

## 2016-06-09 MED ORDER — ZOLPIDEM TARTRATE 5 MG PO TABS: 5.0000 mg | ORAL_TABLET | Freq: Every evening | ORAL | Status: DC | PRN

## 2016-06-09 MED ORDER — FENTANYL CITRATE (PF) 100 MCG/2ML IJ SOLN
INTRAMUSCULAR | Status: DC | PRN
  Administered 2016-06-09: 25 ug via INTRATHECAL

## 2016-06-09 MED ORDER — SIMETHICONE 80 MG PO CHEW
80.0000 mg | CHEWABLE_TABLET | ORAL | Status: DC
Start: 2016-06-10 — End: 2016-06-12
  Administered 2016-06-10 – 2016-06-12 (×3): 80 mg via ORAL
  Filled 2016-06-09 (×3): qty 1

## 2016-06-09 MED ORDER — MORPHINE SULFATE (PF) 0.5 MG/ML IJ SOLN
INTRAMUSCULAR | Status: DC | PRN
  Administered 2016-06-09: .1 mg via INTRATHECAL

## 2016-06-09 MED ORDER — OXYCODONE-ACETAMINOPHEN 5-325 MG PO TABS: 2.0000 | ORAL_TABLET | ORAL | Status: DC | PRN

## 2016-06-09 MED ORDER — TETANUS-DIPHTH-ACELL PERTUSSIS 5-2.5-18.5 LF-MCG/0.5 IM SUSP: 0.5000 mL | Freq: Once | INTRAMUSCULAR | Status: DC

## 2016-06-09 SURGICAL SUPPLY — 30 items
BENZOIN TINCTURE PRP APPL 2/3 (GAUZE/BANDAGES/DRESSINGS) ×3 IMPLANT
CHLORAPREP W/TINT 26ML (MISCELLANEOUS) ×3 IMPLANT
CLAMP CORD UMBIL (MISCELLANEOUS) IMPLANT
CLOSURE WOUND 1/2 X4 (GAUZE/BANDAGES/DRESSINGS) ×1
CLOTH BEACON ORANGE TIMEOUT ST (SAFETY) ×3 IMPLANT
DRSG OPSITE POSTOP 4X10 (GAUZE/BANDAGES/DRESSINGS) ×3 IMPLANT
ELECT REM PT RETURN 9FT ADLT (ELECTROSURGICAL) ×3
ELECTRODE REM PT RTRN 9FT ADLT (ELECTROSURGICAL) ×1 IMPLANT
EXTRACTOR VACUUM M CUP 4 TUBE (SUCTIONS) IMPLANT
EXTRACTOR VACUUM M CUP 4' TUBE (SUCTIONS)
GLOVE BIOGEL PI IND STRL 7.0 (GLOVE) ×1 IMPLANT
GLOVE BIOGEL PI INDICATOR 7.0 (GLOVE) ×2
GLOVE SURG ORTHO 8.0 STRL STRW (GLOVE) ×3 IMPLANT
GOWN STRL REUS W/TWL LRG LVL3 (GOWN DISPOSABLE) ×6 IMPLANT
KIT ABG SYR 3ML LUER SLIP (SYRINGE) ×3 IMPLANT
NEEDLE HYPO 25X5/8 SAFETYGLIDE (NEEDLE) ×3 IMPLANT
NS IRRIG 1000ML POUR BTL (IV SOLUTION) ×3 IMPLANT
PACK C SECTION WH (CUSTOM PROCEDURE TRAY) ×3 IMPLANT
PAD OB MATERNITY 4.3X12.25 (PERSONAL CARE ITEMS) ×3 IMPLANT
PENCIL SMOKE EVAC W/HOLSTER (ELECTROSURGICAL) ×3 IMPLANT
STRIP CLOSURE SKIN 1/2X4 (GAUZE/BANDAGES/DRESSINGS) ×2 IMPLANT
SUT MNCRL 0 VIOLET CTX 36 (SUTURE) ×4 IMPLANT
SUT MON AB 4-0 PS1 27 (SUTURE) ×3 IMPLANT
SUT MONOCRYL 0 CTX 36 (SUTURE) ×8
SUT PDS AB 1 CT  36 (SUTURE) ×2
SUT PDS AB 1 CT 36 (SUTURE) ×1 IMPLANT
SUT VIC AB 1 CTX 36 (SUTURE)
SUT VIC AB 1 CTX36XBRD ANBCTRL (SUTURE) IMPLANT
TOWEL OR 17X24 6PK STRL BLUE (TOWEL DISPOSABLE) ×3 IMPLANT
TRAY FOLEY CATH SILVER 14FR (SET/KITS/TRAYS/PACK) ×3 IMPLANT

## 2016-06-09 NOTE — Anesthesia Postprocedure Evaluation (Signed)
Anesthesia Post Note  Patient: Bianca Jones  Procedure(s) Performed: Procedure(s) (LRB): REPEAT CESAREAN SECTION (N/A)  Patient location during evaluation: PACU Anesthesia Type: Spinal Level of consciousness: awake Pain management: satisfactory to patient Vital Signs Assessment: post-procedure vital signs reviewed and stable Respiratory status: spontaneous breathing Cardiovascular status: blood pressure returned to baseline Postop Assessment: no headache and spinal receding Anesthetic complications: no     Last Vitals:  Vitals:   06/09/16 0945 06/09/16 1004  BP:  115/70  Pulse:  80  Resp:  16  Temp: 36.7 C 36.8 C    Last Pain:  Vitals:   06/09/16 1004  TempSrc: Oral  PainSc:    Pain Goal: Patients Stated Pain Goal: 3 (06/09/16 0640)               Cristela BlueJACKSON,Cannie Muckle EDWARD

## 2016-06-09 NOTE — Op Note (Signed)
Cesarean Section Procedure Note  Pre-operative Diagnosis: IUP at 39 weeks, prev C/S desires repeat, Transverse presentation  Post-operative Diagnosis: same  Surgeon: Turner DanielsLOWE,Coal Nearhood C   Assistants: Kennith Centerracey, RN surgical scrub   Anesthesia: spinal  Procedure:  Low Segment Transverse cesarean section  Procedure Details  The patient was seen in the Holding Room. The risks, benefits, complications, treatment options, and expected outcomes were discussed with the patient.  The patient concurred with the proposed plan, giving informed consent.  The site of surgery properly noted/marked.. A Time Out was held and the above information confirmed.  After induction of anesthesia, the patient was draped and prepped in the usual sterile manner. A Pfannenstiel incision was made and carried down through the subcutaneous tissue to the fascia. Fascial incision was made and extended transversely. The fascia was separated from the underlying rectus tissue superiorly and inferiorly. The peritoneum was identified and entered. Peritoneal incision was extended longitudinally. The utero-vesical peritoneal reflection was incised transversely and the bladder flap was bluntly freed from the lower uterine segment. A low transverse uterine incision was made. Delivered from footling breech presentation was a baby with Apgar scores of 8 at one minute and 9 at five minutes. After the umbilical cord was clamped and cut cord blood was obtained for evaluation. The placenta was removed intact and appeared normal. The uterine outline, tubes and ovaries appeared normal. The uterine incision was closed with running locked sutures of 0 monocryl and imbricated with 0 monocryl. Hemostasis was observed. Lavage was carried out until clear. The peritoneum was then closed with 0 monocryl and rectus muscles plicated in the midline.  After hemostasis was assured, the fascia was then reapproximated with running sutures of 0 PDS. Irrigation was applied  and after adequate hemostasis was assured, the skin was reapproximated with subcutaneous sutures using 4-0 monocryl.  Instrument, sponge, and needle counts were correct prior the abdominal closure and at the conclusion of the case. The patient received 2 grams cefotetan preoperatively.  Findings: Viable female  Estimated Blood Loss:  600cc         Specimens: Placenta was sent to labor and delivery         Complications:  None

## 2016-06-09 NOTE — Transfer of Care (Signed)
Immediate Anesthesia Transfer of Care Note  Patient: Bianca Jones  Procedure(s) Performed: Procedure(s) with comments: REPEAT CESAREAN SECTION (N/A) - REPEAT EDC 06/16/16 NKDA RNFA- Kennith Centerracey T  Patient Location: PACU  Anesthesia Type:Spinal  Level of Consciousness: awake, alert  and oriented  Airway & Oxygen Therapy: Patient Spontanous Breathing  Post-op Assessment: Report given to RN and Post -op Vital signs reviewed and stable  Post vital signs: Reviewed and stable  Last Vitals:  Vitals:   06/09/16 0640  BP: 119/84  Pulse: 96  Resp: 16  Temp: 36.4 C    Last Pain:  Vitals:   06/09/16 0640  TempSrc: Oral      Patients Stated Pain Goal: 3 (06/09/16 0640)  Complications: No apparent anesthesia complications

## 2016-06-09 NOTE — Addendum Note (Signed)
Addendum  created 06/09/16 1315 by Shanon PayorSuzanne M Emberly Tomasso, CRNA   Sign clinical note

## 2016-06-09 NOTE — Anesthesia Preprocedure Evaluation (Signed)

## 2016-06-09 NOTE — Anesthesia Procedure Notes (Signed)

## 2016-06-09 NOTE — Anesthesia Postprocedure Evaluation (Signed)
Anesthesia Post Note  Patient: Steven B Zwart  Procedure(s) Performed: Procedure(s) (LRB): REPEAT CESAREAN SECTION (N/A)  Patient location during evaluation: Mother Baby Anesthesia Type: Spinal Level of consciousness: awake and alert and oriented Pain management: satisfactory to patient Vital Signs Assessment: post-procedure vital signs reviewed and stable Respiratory status: spontaneous breathing and nonlabored ventilation Cardiovascular status: stable Postop Assessment: no headache, no backache, patient able to bend at knees, no signs of nausea or vomiting and adequate PO intake Anesthetic complications: no     Last Vitals:  Vitals:   06/09/16 0945 06/09/16 1004  BP:  115/70  Pulse:  80  Resp:  16  Temp: 36.7 C 36.8 C    Last Pain:  Vitals:   06/09/16 1004  TempSrc: Oral  PainSc:    Pain Goal: Patients Stated Pain Goal: 3 (06/09/16 0640)               Madison HickmanGREGORY,Mannat Benedetti

## 2016-06-09 NOTE — Lactation Note (Addendum)
This note was copied from a baby's chart. Lactation Consultation Note  Patient Name: Bianca Jones ZOXWR'UToday's Date: 06/09/2016 Reason for consuTrudie Reedlt: Initial assessment   Initial consult with Exp BF mom of 2 hour old infant. Mom reports she BF her 1st child for 13-14 months. She denies problems with BF her daughter.  Mom is holding infant. She reports she has attempted to BF "Casimiro NeedleMichael" a few times since birth, he has not fed. Infant is noted to be grunting intermittently. Asher MuirJamie, RN reports they have attempted to feed him and that they are monitoring his respiratory status.   Enc mom to feed infant 8-12 x in 24 hours at first feeding cues. BF basics reviewed. Enc mom to use pillow support with feedings. Mom reports she worked with Nurse, mental healthBarb Carder with her first child and plans to do so again.  Feeding log given with instructions for use. BF Resources Handout and LC Brochure given, mom informed of IP/OP Services, BF Support Groups and LC phone #. mom denies questions/concerns at this time. Enc mom to call out to desk for feeding assistance as needed. Follow up tomorrow and prn.    Maternal Data Formula Feeding for Exclusion: No Does the patient have breastfeeding experience prior to this delivery?: Yes  Feeding    LATCH Score/Interventions                      Lactation Tools Discussed/Used WIC Program: No   Consult Status Consult Status: Follow-up Date: 06/10/16 Follow-up type: In-patient    Silas FloodSharon S Hice 06/09/2016, 10:42 AM

## 2016-06-10 ENCOUNTER — Encounter (HOSPITAL_COMMUNITY): Payer: Self-pay | Admitting: Obstetrics and Gynecology

## 2016-06-10 LAB — CBC
HCT: 28 % — ABNORMAL LOW (ref 36.0–46.0)
Hemoglobin: 9.6 g/dL — ABNORMAL LOW (ref 12.0–15.0)
MCH: 30.1 pg (ref 26.0–34.0)
MCHC: 34.3 g/dL (ref 30.0–36.0)
MCV: 87.8 fL (ref 78.0–100.0)
PLATELETS: 213 10*3/uL (ref 150–400)
RBC: 3.19 MIL/uL — ABNORMAL LOW (ref 3.87–5.11)
RDW: 13.2 % (ref 11.5–15.5)
WBC: 11 10*3/uL — AB (ref 4.0–10.5)

## 2016-06-10 LAB — BIRTH TISSUE RECOVERY COLLECTION (PLACENTA DONATION)

## 2016-06-10 MED ORDER — DIBUCAINE 1 % RE OINT: 1.0000 "application " | TOPICAL_OINTMENT | RECTAL | Status: DC | PRN

## 2016-06-10 MED ORDER — OXYCODONE-ACETAMINOPHEN 5-325 MG PO TABS
2.0000 | ORAL_TABLET | ORAL | Status: DC | PRN
  Administered 2016-06-10 – 2016-06-11 (×2): 2 via ORAL
  Filled 2016-06-10 (×2): qty 2

## 2016-06-10 MED ORDER — WITCH HAZEL-GLYCERIN EX PADS: 1.0000 "application " | MEDICATED_PAD | CUTANEOUS | Status: DC | PRN

## 2016-06-10 NOTE — Lactation Note (Signed)
This note was copied from a baby's chart. Lactation Consultation Note Mom stated BF going well. Baby is latching better. Slept during the night w/o interest of BF. Waken up feeding. Discussed cluster feeding, STS, I&O, supply and demand. FOB done STS while mom sleeping. Mom BF her daughter for 13 months.  Patient Name: Bianca Jones ZOXWR'UToday's Date: 06/10/2016 Reason for consult: Follow-up assessment   Maternal Data    Feeding Feeding Type: Breast Fed Length of feed: 15 min  LATCH Score/Interventions                      Lactation Tools Discussed/Used     Consult Status Consult Status: Follow-up Date: 06/11/16 Follow-up type: In-patient    Charyl DancerCARVER, Bianca Jones 06/10/2016, 4:42 AM

## 2016-06-10 NOTE — Progress Notes (Signed)
Subjective: Postpartum Day 1: Cesarean Delivery Patient reports tolerating PO and no problems voiding.    Objective: Vital signs in last 24 hours: Temp:  [97.3 F (36.3 C)-98.3 F (36.8 C)] 97.6 F (36.4 C) (11/10 0405) Pulse Rate:  [75-92] 75 (11/10 0405) Resp:  [16-18] 18 (11/10 0405) BP: (97-115)/(56-70) 99/61 (11/10 0405) SpO2:  [94 %-98 %] 96 % (11/10 0405)  Physical Exam:  General: alert and cooperative Lochia: appropriate Uterine Fundus: firm Incision: healing well, honeycomb dressing changed this am, no active bleeding observed DVT Evaluation: No evidence of DVT seen on physical exam. Negative Homan's sign. No cords or calf tenderness. No significant calf/ankle edema.   Recent Labs  06/08/16 1147 06/10/16 0609  HGB 12.2 9.6*  HCT 35.9* 28.0*    Assessment/Plan: Status post Cesarean section. Doing well postoperatively.  Continue current care.  Somaya Grassi G 06/10/2016, 7:56 AM

## 2016-06-11 NOTE — Progress Notes (Signed)
Subjective: Postpartum Day 2 Cesarean Delivery Patient reports incisional pain, tolerating PO, + flatus and no problems voiding.    Objective: Vital signs in last 24 hours: Temp:  [98 F (36.7 C)-98.6 F (37 C)] 98 F (36.7 C) (11/11 0549) Pulse Rate:  [74-95] 74 (11/11 0549) Resp:  [12-18] 18 (11/11 0549) BP: (99-111)/(51-60) 111/51 (11/11 0549)  Physical Exam:  General: alert and cooperative Lochia: appropriate Uterine Fundus: firm Incision: no significant drainage DVT Evaluation: No evidence of DVT seen on physical exam.   Recent Labs  06/08/16 1147 06/10/16 0609  HGB 12.2 9.6*  HCT 35.9* 28.0*    Assessment/Plan: Status post Cesarean section. Doing well postoperatively.  Continue current care.  Bianca Jones 06/11/2016, 7:46 AM

## 2016-06-11 NOTE — Lactation Note (Signed)
This note was copied from a baby's chart. Lactation Consultation Note  Patient Name: Bianca Jones MVHQI'OToday's Date: 06/11/2016 Reason for consult: Follow-up assessment   Follow up with mom of 62 hour old infant. Infant with 10 BF, 1 attempt, 2 voids and 8 stools in 24 hours preceding this assessment. Infant weight 7 lb 4.7 oz with 5% weight loss since birth. LATCH Score 9 by bedside RN. Mom reports he just finished feeding for 20 minutes.   Mom denies nipple pain or tenderness. She reports she is feeling fuller today. Mom denies questions/concerns and feels BF is going well today. Follow up tomorrow and prn.    Maternal Data Formula Feeding for Exclusion: No Has patient been taught Hand Expression?: Yes Does the patient have breastfeeding experience prior to this delivery?: Yes  Feeding Feeding Type: Breast Fed Length of feed: 20 min  LATCH Score/Interventions Latch: Grasps breast easily, tongue down, lips flanged, rhythmical sucking. Intervention(s): Skin to skin;Teach feeding cues  Audible Swallowing: A few with stimulation Intervention(s): Skin to skin Intervention(s): Skin to skin  Type of Nipple: Everted at rest and after stimulation  Comfort (Breast/Nipple): Soft / non-tender     Hold (Positioning): No assistance needed to correctly position infant at breast. Intervention(s): Skin to skin;Breastfeeding basics reviewed  LATCH Score: 9  Lactation Tools Discussed/Used     Consult Status Consult Status: Follow-up Date: 06/12/16 Follow-up type: In-patient    Silas FloodSharon S Anieya Helman 06/11/2016, 10:05 PM

## 2016-06-12 MED ORDER — OXYCODONE-ACETAMINOPHEN 5-325 MG PO TABS: 1.0000 | ORAL_TABLET | Freq: Four times a day (QID) | ORAL | 0 refills | Status: AC | PRN

## 2016-06-12 MED ORDER — IBUPROFEN 600 MG PO TABS: 600.0000 mg | ORAL_TABLET | Freq: Four times a day (QID) | ORAL | 1 refills | Status: AC

## 2016-06-12 NOTE — Discharge Summary (Signed)
Obstetric Discharge Summary Reason for Admission: cesarean section Prenatal Procedures: ultrasound Intrapartum Procedures: cesarean: low cervical, transverse Postpartum Procedures: none Complications-Operative and Postpartum: none Hemoglobin  Date Value Ref Range Status  06/10/2016 9.6 (L) 12.0 - 15.0 g/dL Final    Comment:    DELTA CHECK NOTED REPEATED TO VERIFY    HCT  Date Value Ref Range Status  06/10/2016 28.0 (L) 36.0 - 46.0 % Final    Physical Exam:  General: alert and cooperative Lochia: appropriate Uterine Fundus: firm Incision: no significant drainage DVT Evaluation: No evidence of DVT seen on physical exam.  Discharge Diagnoses: Term Pregnancy-delivered  Discharge Information: Date: 06/12/2016 Activity: pelvic rest Diet: routine Medications: PNV, Ibuprofen and Percocet Condition: stable Instructions: refer to practice specific booklet Discharge to: home   Newborn Data: Live born female  Birth Weight: 7 lb 10.8 oz (3480 g) APGAR: 8, 9  Home with mother.  Takeya Marquis 06/12/2016, 8:08 AM

## 2016-06-12 NOTE — Lactation Note (Signed)
This note was copied from a baby's chart. Lactation Consultation Note  Patient Name: [name] WGNFA'OToday's Date: 06/12/2016 Reason for consult: Follow-up assessment (6% weight loss, @65  hours - Bili - 9.2 ) Baby is 4173 hours old and is for D/C today. Parents Pedis has been in this am ,  And return F/U appt. Tomorrow. Per mom breast are feeling fuller today .  LC reviewed doc flow sheets - Last fed at 0820. Breast feeding consistently 10-60 mins , Latch score range 9's  Voids and stools QS. Mom denies soreness, just fullness. Sore nipple and engorgement prevention and tx reviewed.  Per mom with her 1st baby had to sue a NS, and seemed to not have a volume even though she breast fed a long time.  LC discussed milk coming in around 3-5 days an dif by 4 days milk isn't in - to add 3 post pumping after breast feeding when the baby isn't  Cluster feeding. Also recommended google - Lactation cookies, consider herbs to enhance volume - Lactation Support. Mom familiar with  [place] - took with her 1st baby, [facility] To check on milk transfer.  Per mom has a manual and a DEBP Medela at home.  Mother informed of post-discharge support and given phone number to the lactation department, including services for phone call assistance; out-patient appointments; and breastfeeding support group. List of other breastfeeding resources in the community given in the handout. Encouraged mother to call for problems or concerns related to breastfeeding.  Maternal Data    Feeding Feeding Type:  (per mom baby last fed at 0820 11 mins ) Length of feed: 11 min (per mom )  LATCH Score/Interventions                Intervention(s): Breastfeeding basics reviewed (see LC note )     Lactation Tools Discussed/Used WIC Program: No Pump Review: Milk Storage   Consult Status Consult Status: Complete Date: 06/12/16    Bianca GreathouseMargaret Ann Morad Tal 06/12/2016, 9:46 AM

## 2016-07-04 ENCOUNTER — Encounter (HOSPITAL_COMMUNITY): Payer: Self-pay | Admitting: *Deleted

## 2019-11-16 ENCOUNTER — Emergency Department (HOSPITAL_BASED_OUTPATIENT_CLINIC_OR_DEPARTMENT_OTHER): Payer: BC Managed Care – PPO

## 2019-11-16 ENCOUNTER — Emergency Department (HOSPITAL_BASED_OUTPATIENT_CLINIC_OR_DEPARTMENT_OTHER)
Admission: EM | Admit: 2019-11-16 | Discharge: 2019-11-16 | Disposition: A | Discharge status: Home / Self Care | Payer: BC Managed Care – PPO | Attending: Emergency Medicine | Admitting: Emergency Medicine

## 2019-11-16 ENCOUNTER — Encounter (HOSPITAL_BASED_OUTPATIENT_CLINIC_OR_DEPARTMENT_OTHER): Payer: Self-pay | Admitting: Emergency Medicine

## 2019-11-16 ENCOUNTER — Other Ambulatory Visit: Payer: Self-pay

## 2019-11-16 DIAGNOSIS — Y929 Unspecified place or not applicable: Secondary | ICD-10-CM | POA: Insufficient documentation

## 2019-11-16 DIAGNOSIS — S39012A Strain of muscle, fascia and tendon of lower back, initial encounter: Secondary | ICD-10-CM | POA: Diagnosis not present

## 2019-11-16 DIAGNOSIS — Y9389 Activity, other specified: Secondary | ICD-10-CM | POA: Insufficient documentation

## 2019-11-16 DIAGNOSIS — Y999 Unspecified external cause status: Secondary | ICD-10-CM | POA: Diagnosis not present

## 2019-11-16 DIAGNOSIS — X501XXA Overexertion from prolonged static or awkward postures, initial encounter: Secondary | ICD-10-CM | POA: Diagnosis not present

## 2019-11-16 DIAGNOSIS — J45909 Unspecified asthma, uncomplicated: Secondary | ICD-10-CM | POA: Diagnosis not present

## 2019-11-16 DIAGNOSIS — S3992XA Unspecified injury of lower back, initial encounter: Secondary | ICD-10-CM | POA: Diagnosis present

## 2019-11-16 DIAGNOSIS — Z8673 Personal history of transient ischemic attack (TIA), and cerebral infarction without residual deficits: Secondary | ICD-10-CM | POA: Diagnosis not present

## 2019-11-16 LAB — URINALYSIS, ROUTINE W REFLEX MICROSCOPIC
Bilirubin Urine: NEGATIVE
Glucose, UA: NEGATIVE mg/dL
Hgb urine dipstick: NEGATIVE
Ketones, ur: NEGATIVE mg/dL
Leukocytes,Ua: NEGATIVE
Nitrite: NEGATIVE
Protein, ur: NEGATIVE mg/dL
Specific Gravity, Urine: >1.03 — ABNORMAL HIGH (ref 1.005–1.030)
pH: 6 (ref 5.0–8.0)

## 2019-11-16 LAB — PREGNANCY, URINE: Preg Test, Ur: NEGATIVE

## 2019-11-16 MED ORDER — DIAZEPAM 2 MG PO TABS
2.0000 mg | ORAL_TABLET | Freq: Once | ORAL | Status: AC
  Administered 2019-11-16: 21:00:00 2 mg via ORAL
  Filled 2019-11-16: qty 1

## 2019-11-16 MED ORDER — HYDROMORPHONE HCL 1 MG/ML IJ SOLN
1.0000 mg | Freq: Once | INTRAMUSCULAR | Status: AC
  Administered 2019-11-16: 1 mg via INTRAVENOUS
  Filled 2019-11-16: qty 1

## 2019-11-16 MED ORDER — PREDNISONE 20 MG PO TABS: 40.0000 mg | ORAL_TABLET | Freq: Every day | ORAL | 0 refills | Status: AC

## 2019-11-16 NOTE — ED Notes (Signed)
Pt up for discharge, clarified with EDP Dykstra about order for post residual measurement. EDP stated no longer needed and to cancel the order.

## 2019-11-16 NOTE — ED Triage Notes (Signed)
Pt states hx of back pain, this morning states she bent over and "back went out". Reports being seen at [facility] and given antinflammatory, tramadol and muscle relaxants. She states she got the Rx filled and has taken it without relief. Last dose of Robaxin around 1700.

## 2019-11-16 NOTE — Discharge Instructions (Signed)
Recommend continuing your previously prescribed medication from urgent care.  Take as directed.  I would additionally recommend trial of a steroid.  See prescription.  Recommend recheck with your primary doctor next week.  If you develop bladder or bowel incontinence, numbness, weakness, fevers, return to ER for reassessment.

## 2019-11-17 NOTE — ED Provider Notes (Signed)
[facility] EMERGENCY DEPARTMENT Provider Note   CSN: [id number] Arrival date & time: 11/16/19  1905     History Chief Complaint  Patient presents with  . Back Pain    Bianca Jones is a 44 y.o. female.  Presented to ER with complaints of low back pain.  Patient reports early morning she was bending over when all of a sudden she had sudden onset severe low back pain, felt like her back went out.  Reports that she has had similar episodes to this, but normally does not deal with daily back pain.  Went to urgent care, provided NSAID, tramadol, Robaxin.  Has tried these medicines without any significant relief.  Pain is primarily lower back, slightly worse on right side, sharp, shooting pain.  Comes and goes in waves, up to severe.  Currently moderate.  No associated fevers, no bladder or bowel incontinence, no urinary retention.  No history IVDU.  No prior history epidural abscess.  Or back surgery.  HPI     Past Medical History:  Diagnosis Date  . AMA (advanced maternal age) multigravida 37+   . Asthma   . H/O hiatal hernia   . Herpes    rare outbreaks, was told by prev MD she had +HSV cultures  . IBS (irritable bowel syndrome)   . Multiple thyroid nodules   . Narcolepsy   . Stroke [facility])    mild age 63  . Vaginal Pap smear, abnormal     Patient Active Problem List   Diagnosis Date Noted  . Cesarean delivery delivered 06/09/2016  . S/P cesarean section 12/31/2013  . Normal pregnancy 12/30/2013  . OTITIS MEDIA, RIGHT 05/05/2010  . MENSTRUAL BLEEDING, ABNORMAL 05/05/2010  . DEPRESSION 04/01/2010  . ASTHMA 04/01/2010  . HEADACHE 04/01/2010  . FREQUENCY, URINARY 04/01/2010  . CEREBROVASCULAR ACCIDENT, HX OF 04/01/2010    Past Surgical History:  Procedure Laterality Date  . BREAST SURGERY     benign breast cystectomy  . CESAREAN SECTION N/A 12/31/2013   Procedure: CESAREAN SECTION;  Surgeon: Cyril Mourning, MD;  Location: [facility];  Service: Obstetrics;   Laterality: N/A;  . CESAREAN SECTION N/A 06/09/2016   Procedure: REPEAT CESAREAN SECTION;  Surgeon: Louretta Shorten, MD;  Location: [facility];  Service: Obstetrics;  Laterality: N/A;  REPEAT EDC 06/16/16 NKDA RNFA- [facility]  . DILATION AND CURETTAGE OF UTERUS    . EYE SURGERY       OB History    Gravida  2   Para  2   Term  2   Preterm      AB      Living  1     SAB      TAB      Ectopic      Multiple  0   Live Births  1           Family History  Problem Relation Age of Onset  . Hypertension Mother   . Rheum arthritis Mother   . Anxiety disorder Mother   . Depression Mother   . Cancer Mother        breast  . Hypertension Father   . Rheum arthritis Father   . Rheum arthritis Maternal Aunt   . Cancer Maternal Aunt   . Heart attack Maternal Grandfather   . Heart attack Paternal Grandfather     Social History   Tobacco Use  . Smoking status: Never Smoker  . Smokeless tobacco: Never Used  Substance  Use Topics  . Alcohol use: No    Comment: social  . Drug use: No    Home Medications Prior to Admission medications   Medication Sig Start Date End Date Taking? Authorizing Provider  ibuprofen (ADVIL,MOTRIN) 600 MG tablet Take 1 tablet (600 mg total) by mouth every 6 (six) hours. 06/12/16   Zelphia Cairo, MD  oxyCODONE-acetaminophen (PERCOCET/ROXICET) 5-325 MG tablet Take 1-2 tablets by mouth every 6 (six) hours as needed for severe pain (moderate - severe pain). 06/12/16   Zelphia Cairo, MD  predniSONE (DELTASONE) 20 MG tablet Take 2 tablets (40 mg total) by mouth daily with breakfast for 4 days. 11/16/19 11/20/19  Milagros Loll, MD  Prenatal Vit-Fe Fumarate-FA (PRENATAL MULTIVITAMIN) TABS tablet Take 1 tablet by mouth daily at 12 noon. 01/03/14   Julio Sicks, NP  ranitidine (ZANTAC) 150 MG tablet Take 150 mg by mouth 2 (two) times daily.    [provider]    Allergies    Patient has no known allergies.  Review of Systems    Review of Systems  Constitutional: Negative for chills and fever.  HENT: Negative for ear pain and sore throat.   Eyes: Negative for pain and visual disturbance.  Respiratory: Negative for cough and shortness of breath.   Cardiovascular: Negative for chest pain and palpitations.  Gastrointestinal: Negative for abdominal pain and vomiting.  Genitourinary: Negative for dysuria and hematuria.  Musculoskeletal: Positive for back pain. Negative for arthralgias.  Skin: Negative for color change and rash.  Neurological: Negative for seizures and syncope.  All other systems reviewed and are negative.   Physical Exam Updated Vital Signs BP 108/74 (BP Location: Right Arm)   Pulse 73   Temp 98.5 F (36.9 C) (Oral)   Resp 15   Ht 5\' 7"  (1.702 m)   Wt 83.5 kg   SpO2 97%   BMI 28.82 kg/m   Physical Exam Vitals and nursing note reviewed.  Constitutional:      General: She is not in acute distress.    Appearance: She is well-developed.  HENT:     Head: Normocephalic and atraumatic.  Eyes:     Conjunctiva/sclera: Conjunctivae normal.  Cardiovascular:     Rate and Rhythm: Normal rate and regular rhythm.     Heart sounds: No murmur.  Pulmonary:     Effort: Pulmonary effort is normal. No respiratory distress.     Breath sounds: Normal breath sounds.  Abdominal:     Palpations: Abdomen is soft.     Tenderness: There is no abdominal tenderness.  Musculoskeletal:     Cervical back: Neck supple.     Comments: No midline C, T, L-spine tenderness, there is some generalized tenderness across lumbar region  Skin:    General: Skin is warm and dry.  Neurological:     General: No focal deficit present.     Mental Status: She is alert and oriented to person, place, and time.     Comments: 5 out of 5 strength in bilateral lower extremities, sensation to light touch intact in bilateral lower extremities     ED Results / Procedures / Treatments   Labs (all labs ordered are listed, but only  abnormal results are displayed) Labs Reviewed  URINALYSIS, ROUTINE W REFLEX MICROSCOPIC - Abnormal; Notable for the following components:      Result Value   APPearance CLOUDY (*)    Specific Gravity, Urine >1.030 (*)    All other components within normal limits  PREGNANCY, URINE  EKG None  Radiology DG Lumbar Spine Complete  Result Date: 11/16/2019 CLINICAL DATA:  44 year old female with low back pain. EXAM: LUMBAR SPINE - COMPLETE 4+ VIEW COMPARISON:  Lumbar spine MRI dated 01/13/2010. FINDINGS: Five lumbar type vertebra. There is no acute fracture or subluxation of the lumbar spine. There is straightening of normal lumbar lordosis. There is degenerative changes with disc space narrowing and endplate irregularity at L5-S1. Probable mild associated narrowing of the neural foramina at this level. The visualized posterior elements are otherwise intact. The soft tissues are unremarkable. An intrauterine device is noted. IMPRESSION: 1. No acute/traumatic lumbar spine pathology. 2. Straightening of normal lumbar lordosis and degenerative changes at L5-S1. Electronically Signed   By: Elgie Collard M.D.   On: 11/16/2019 21:38    Procedures Procedures (including critical care time)  Medications Ordered in ED Medications  diazepam (VALIUM) tablet 2 mg (2 mg Oral Given 11/16/19 2051)  HYDROmorphone (DILAUDID) injection 1 mg (1 mg Intravenous Given 11/16/19 2052)    ED Course  I have reviewed the triage vital signs and the nursing notes.  Pertinent labs & imaging results that were available during my care of the patient were reviewed by me and considered in my medical decision making (see chart for details).    MDM Rules/Calculators/A&P                      44 year old lady presented to ER with acute onset low back pain.  X-ray negative, neuro exam negative.  No red flag symptoms.  Suspect lumbar strain.  Good symptomatic treatment in ER after receiving dose of Valium, Dilaudid.   Ambulatory without issues.  Will recommend trial of prednisone.  Already have Rx for tramadol and Robaxin from urgent care.  Reviewed return precautions, recommended recheck with PCP next week.    After the discussed management above, the patient was determined to be safe for discharge.  The patient was in agreement with this plan and all questions regarding their care were answered.  ED return precautions were discussed and the patient will return to the ED with any significant worsening of condition.    Final Clinical Impression(s) / ED Diagnoses Final diagnoses:  Strain of lumbar region, initial encounter    Rx / DC Orders ED Discharge Orders         Ordered    predniSONE (DELTASONE) 20 MG tablet  Daily with breakfast     11/16/19 2215           Milagros Loll, MD 11/17/19 1501

## 2020-11-24 ENCOUNTER — Emergency Department (HOSPITAL_BASED_OUTPATIENT_CLINIC_OR_DEPARTMENT_OTHER)
Admission: EM | Admit: 2020-11-24 | Discharge: 2020-11-24 | Disposition: A | Discharge status: Home / Self Care | Payer: BC Managed Care – PPO | Attending: Emergency Medicine | Admitting: Emergency Medicine

## 2020-11-24 ENCOUNTER — Encounter (HOSPITAL_BASED_OUTPATIENT_CLINIC_OR_DEPARTMENT_OTHER): Payer: Self-pay | Admitting: *Deleted

## 2020-11-24 ENCOUNTER — Emergency Department (HOSPITAL_BASED_OUTPATIENT_CLINIC_OR_DEPARTMENT_OTHER): Payer: BC Managed Care – PPO

## 2020-11-24 ENCOUNTER — Other Ambulatory Visit: Payer: Self-pay

## 2020-11-24 DIAGNOSIS — R519 Headache, unspecified: Secondary | ICD-10-CM | POA: Diagnosis not present

## 2020-11-24 DIAGNOSIS — J45909 Unspecified asthma, uncomplicated: Secondary | ICD-10-CM | POA: Diagnosis not present

## 2020-11-24 DIAGNOSIS — H9212 Otorrhea, left ear: Secondary | ICD-10-CM | POA: Insufficient documentation

## 2020-11-24 DIAGNOSIS — R269 Unspecified abnormalities of gait and mobility: Secondary | ICD-10-CM | POA: Diagnosis not present

## 2020-11-24 NOTE — Discharge Instructions (Addendum)
Your head CT today is normal. As discussed, recommend follow-up with your doctor or your neurologist for further evaluation of your frequent tripping and other symptoms today.

## 2020-11-24 NOTE — ED Triage Notes (Signed)
C/o head pressure and tingling x 3 days , also c/o left ear drainage x 2 days

## 2020-11-24 NOTE — ED Notes (Signed)
Pt describes having a weird throbbing sensation on top of head.  Sates she has had left ear drainage x 2 days.  Also states she has been tripping a lot lately.  Pupils are reactive & unequal, pt states this is normal for her.  Strength is normal in all 4 extremities

## 2020-11-24 NOTE — ED Provider Notes (Signed)
[facility] EMERGENCY DEPARTMENT Provider Note   CSN: [id number] Arrival date & time: 11/24/20  1756     History Chief Complaint  Patient presents with  . Headache    Bianca Jones is a 45 y.o. female.  45 year old female with past medical history of stroke (age 1, cause undetermined, reports poor heat tolerance and anisocoria) presents with complaint of drainage from the left ear for the past few days, tripping for the past few months, now with concern for a divot/palpable irregular area of her scalp which she first noticed tonight.  Reports a strange sensation across her head earlier. Due to her history, became concerned and came to the ER. Denies changes in vision, speech, unilateral weakness or numbness. No other complaints or concerns.         Past Medical History:  Diagnosis Date  . AMA (advanced maternal age) multigravida 35+   . Asthma   . H/O hiatal hernia   . Herpes    rare outbreaks, was told by prev MD she had +HSV cultures  . IBS (irritable bowel syndrome)   . Multiple thyroid nodules   . Narcolepsy   . Stroke [facility])    mild age 78  . Vaginal Pap smear, abnormal     Patient Active Problem List   Diagnosis Date Noted  . Cesarean delivery delivered 06/09/2016  . S/P cesarean section 12/31/2013  . Normal pregnancy 12/30/2013  . OTITIS MEDIA, RIGHT 05/05/2010  . MENSTRUAL BLEEDING, ABNORMAL 05/05/2010  . DEPRESSION 04/01/2010  . ASTHMA 04/01/2010  . HEADACHE 04/01/2010  . FREQUENCY, URINARY 04/01/2010  . CEREBROVASCULAR ACCIDENT, HX OF 04/01/2010    Past Surgical History:  Procedure Laterality Date  . BREAST SURGERY     benign breast cystectomy  . CESAREAN SECTION N/A 12/31/2013   Procedure: CESAREAN SECTION;  Surgeon: Jeani Hawking, MD;  Location: [facility];  Service: Obstetrics;  Laterality: N/A;  . CESAREAN SECTION N/A 06/09/2016   Procedure: REPEAT CESAREAN SECTION;  Surgeon: Candice Camp, MD;  Location: [facility];  Service:  Obstetrics;  Laterality: N/A;  REPEAT EDC 06/16/16 NKDA RNFA- [facility]  . DILATION AND CURETTAGE OF UTERUS    . EYE SURGERY       OB History    Gravida  2   Para  2   Term  2   Preterm      AB      Living  1     SAB      IAB      Ectopic      Multiple  0   Live Births  1           Family History  Problem Relation Age of Onset  . Hypertension Mother   . Rheum arthritis Mother   . Anxiety disorder Mother   . Depression Mother   . Cancer Mother        breast  . Hypertension Father   . Rheum arthritis Father   . Rheum arthritis Maternal Aunt   . Cancer Maternal Aunt   . Heart attack Maternal Grandfather   . Heart attack Paternal Grandfather     Social History   Tobacco Use  . Smoking status: Never Smoker  . Smokeless tobacco: Never Used  Vaping Use  . Vaping Use: Never used  Substance Use Topics  . Alcohol use: No    Comment: social  . Drug use: No    Home Medications Prior to Admission medications  Medication Sig Start Date End Date Taking? Authorizing Provider  sertraline (ZOLOFT) 100 MG tablet Take 100 mg by mouth. 08/04/20  Yes [provider]  terbinafine (LAMISIL) 250 MG tablet Take 1 tablet by mouth daily. 11/16/20  Yes [provider]  ibuprofen (ADVIL,MOTRIN) 600 MG tablet Take 1 tablet (600 mg total) by mouth every 6 (six) hours. 06/12/16   Zelphia Cairo, MD  oxyCODONE-acetaminophen (PERCOCET/ROXICET) 5-325 MG tablet Take 1-2 tablets by mouth every 6 (six) hours as needed for severe pain (moderate - severe pain). 06/12/16   Zelphia Cairo, MD  Prenatal Vit-Fe Fumarate-FA (PRENATAL MULTIVITAMIN) TABS tablet Take 1 tablet by mouth daily at 12 noon. 01/03/14   Julio Sicks, NP  ranitidine (ZANTAC) 150 MG tablet Take 150 mg by mouth 2 (two) times daily.    [provider]    Allergies    Patient has no known allergies.  Review of Systems   Review of Systems  Constitutional: Negative for fever.  HENT:  Positive for ear discharge. Negative for congestion, ear pain, sneezing and sore throat.   Eyes: Negative for visual disturbance.  Respiratory: Negative for cough.   Gastrointestinal: Negative for nausea and vomiting.  Musculoskeletal: Positive for gait problem. Negative for neck pain and neck stiffness.  Skin: Negative for rash and wound.  Allergic/Immunologic: Negative for immunocompromised state.  Neurological: Positive for dizziness and headaches. Negative for weakness and numbness.  Hematological: Does not bruise/bleed easily.  Psychiatric/Behavioral: Negative for confusion.  All other systems reviewed and are negative.   Physical Exam Updated Vital Signs BP (!) 142/72   Pulse 80   Temp 98.3 F (36.8 C)   Resp 20   Ht 5\' 7"  (1.702 m)   Wt 83.9 kg   SpO2 100%   BMI 28.98 kg/m   Physical Exam Vitals and nursing note reviewed.  Constitutional:      General: She is not in acute distress.    Appearance: She is well-developed. She is not diaphoretic.  HENT:     Head: Normocephalic and atraumatic.      Right Ear: No swelling or tenderness. Tympanic membrane is not injected.     Left Ear: No swelling or tenderness. Tympanic membrane is not injected.  Eyes:     Extraocular Movements: Extraocular movements intact.     Comments: Right pupil slightly smaller than left, baseline/unchanged  Cardiovascular:     Rate and Rhythm: Normal rate and regular rhythm.     Heart sounds: Normal heart sounds.  Pulmonary:     Effort: Pulmonary effort is normal.     Breath sounds: Normal breath sounds.  Musculoskeletal:     Cervical back: Normal range of motion and neck supple.  Lymphadenopathy:     Cervical: No cervical adenopathy.  Skin:    General: Skin is warm and dry.  Neurological:     Mental Status: She is alert and oriented to person, place, and time.     GCS: GCS eye subscore is 4. GCS verbal subscore is 5. GCS motor subscore is 6.     Cranial Nerves: No cranial nerve deficit  or facial asymmetry.     Sensory: No sensory deficit.     Motor: No weakness.     Gait: Gait normal.  Psychiatric:        Speech: Speech normal.        Behavior: Behavior normal.        Cognition and Memory: Memory is not impaired.     ED Results /  Procedures / Treatments   Labs (all labs ordered are listed, but only abnormal results are displayed) Labs Reviewed - No data to display  EKG None  Radiology CT Head Wo Contrast  Result Date: 11/24/2020 CLINICAL DATA:  Nonspecific dizziness. Patient reports left ear drainage. EXAM: CT HEAD WITHOUT CONTRAST TECHNIQUE: Contiguous axial images were obtained from the base of the skull through the vertex without intravenous contrast. COMPARISON:  None. FINDINGS: Brain: No evidence of acute infarction, hemorrhage, hydrocephalus, extra-axial collection or mass lesion/mass effect. Vascular: No hyperdense vessel or unexpected calcification. Skull: Normal. Negative for fracture or focal lesion. Sinuses/Orbits: Paranasal sinuses and mastoid air cells are clear. There is no mastoid effusion. The visualized orbits are unremarkable. Other: None. IMPRESSION: Negative head CT. Electronically Signed   By: Narda Rutherford M.D.   On: 11/24/2020 19:17    Procedures Procedures   Medications Ordered in ED Medications - No data to display  ED Course  I have reviewed the triage vital signs and the nursing notes.  Pertinent labs & imaging results that were available during my care of the patient were reviewed by me and considered in my medical decision making (see chart for details).  Clinical Course as of 11/24/20 1930  Tue Nov 24, 2020  [id number] 45 year old female with complaint as above presents to the emergency room today for evaluation.  Her exam is unremarkable with exception of her unequal pupils which she reports to be baseline for her.  No source found regarding her left ear drainage, her canals are clear, TMs intact although dull.  She does have a  small red lesion to her scalp and area that she palpates as irregular.  CT head obtained due to history of prior CVA with complaint of abnormal head sensation and frequent tripping and is unremarkable.  Patient was advised to follow-up with her neurologist or primary care for further work-up. [LM]    Clinical Course User Index [LM] Alden Hipp   MDM Rules/Calculators/A&P                          Final Clinical Impression(s) / ED Diagnoses Final diagnoses:  Nonintractable headache, unspecified chronicity pattern, unspecified headache type  Gait disturbance    Rx / DC Orders ED Discharge Orders    None       Alden Hipp 11/24/20 1930    Milagros Loll, MD 11/24/20 [phone]

## 2022-08-01 IMAGING — CT CT HEAD W/O CM
3 series · 14 of 47 positions shown, 16 images · non-contrast
Comparison: None.

CLINICAL DATA: Nonspecific dizziness. Patient reports left ear
drainage.

EXAM:
CT HEAD WITHOUT CONTRAST
TECHNIQUE: Contiguous axial images were obtained from the base of the skull
through the vertex without intravenous contrast.

[Series 2: head wo · axial · 0.43mm/px · z∈[+1361,+1496]mm · 8 of 33 slices shown, 10 images]
[im 3/33  brain]
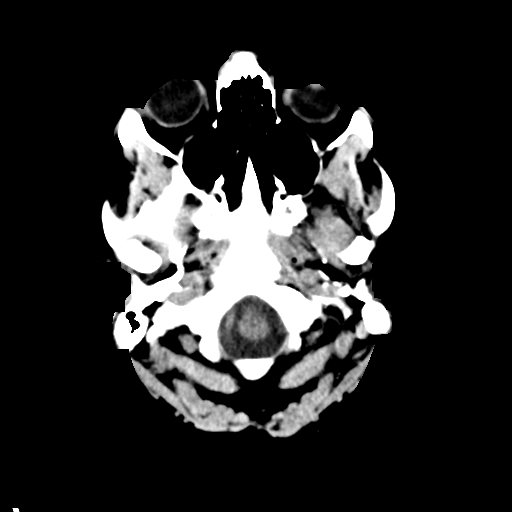
[im 3/33  bone]
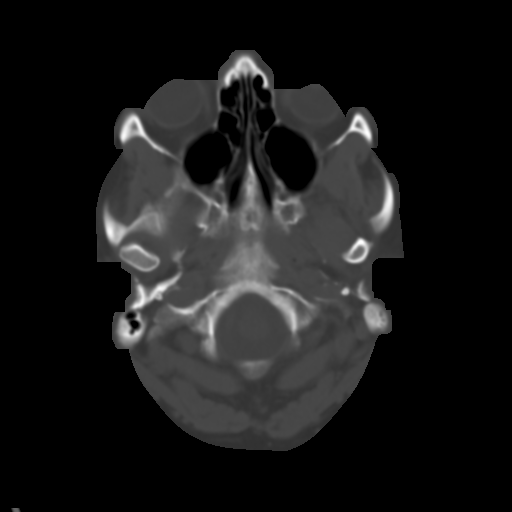
[im 7/33  brain]
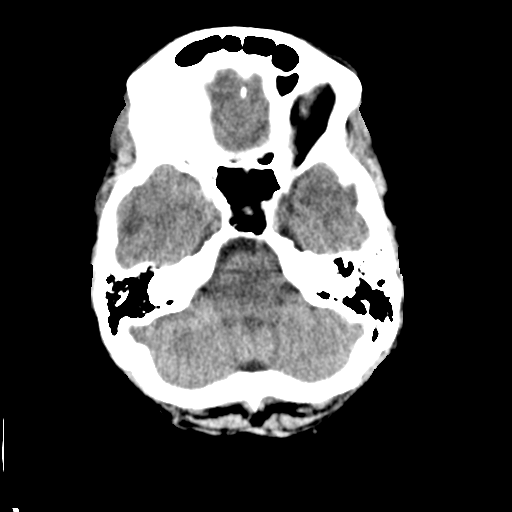
[im 10/33  brain]
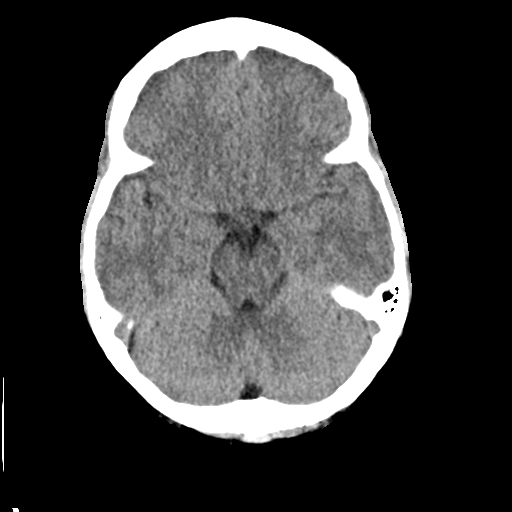
[im 15/33  brain]
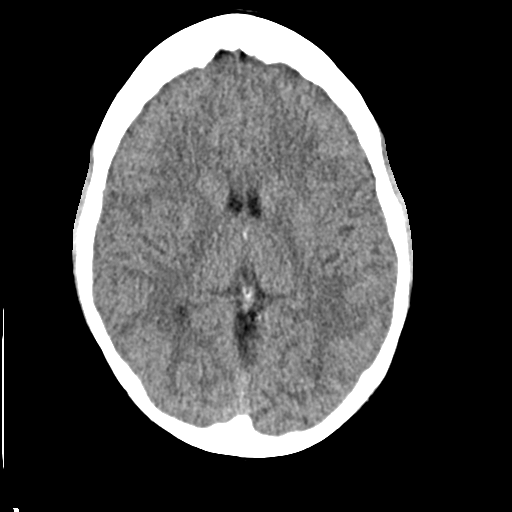
[im 18/33  brain]
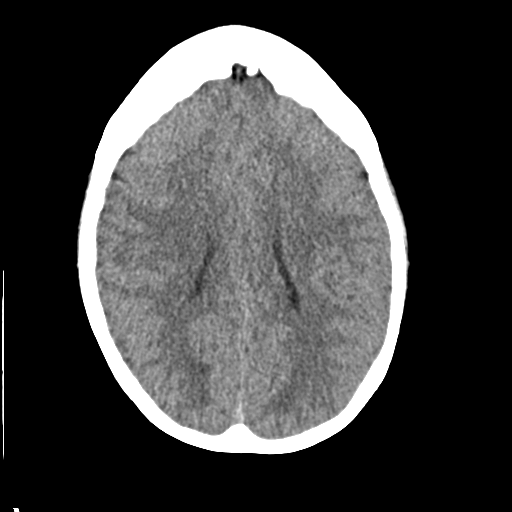
[im 18/33  bone]
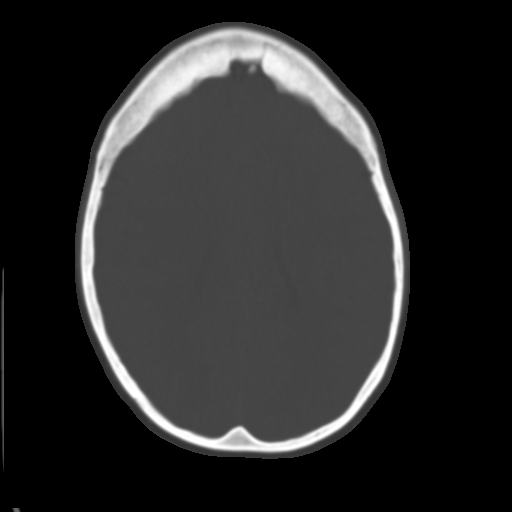
[im 23/33  brain]
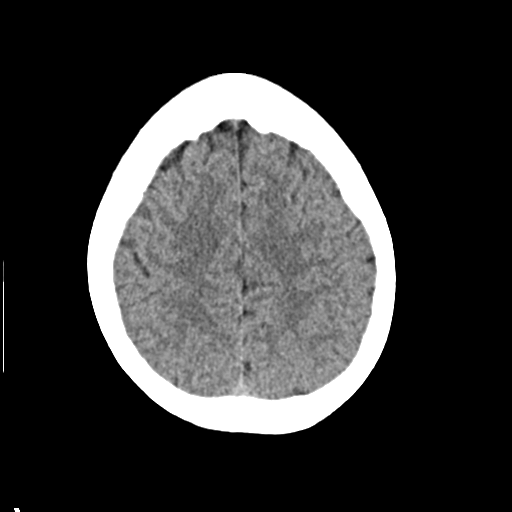
[im 26/33  brain]
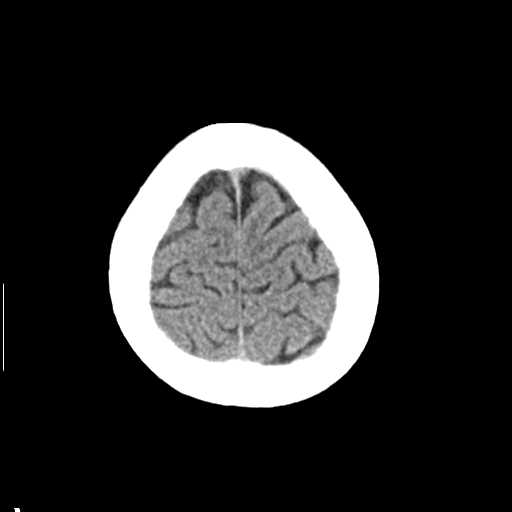
[im 30/33  brain]
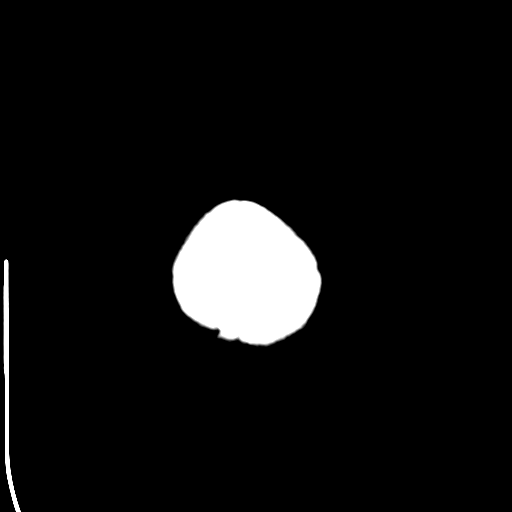

[Series 4: coronal soft · coronal · 0.31mm/px · 3 of 69 slices shown]
[im 23/69  brain]
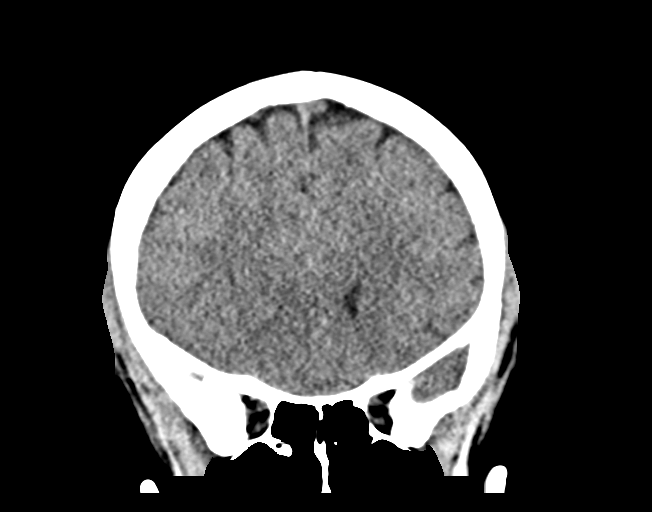
[im 31/69  brain]
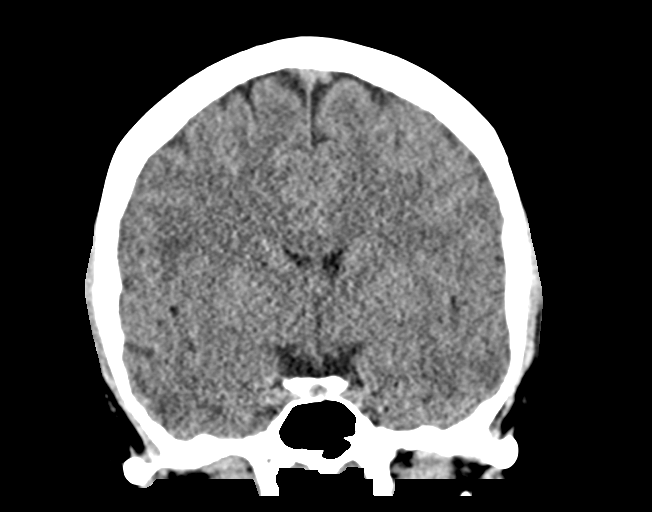
[im 38/69  brain]
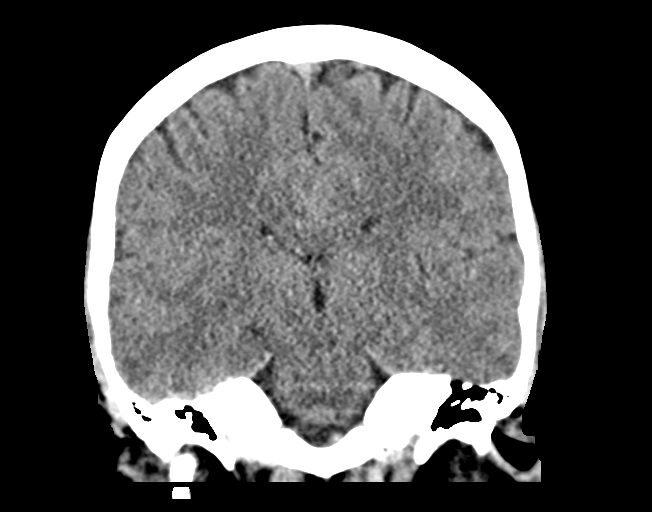

[Series 5: sag soft · sagittal · 0.31mm/px · 3 of 67 slices shown]
[im 23/67  brain]
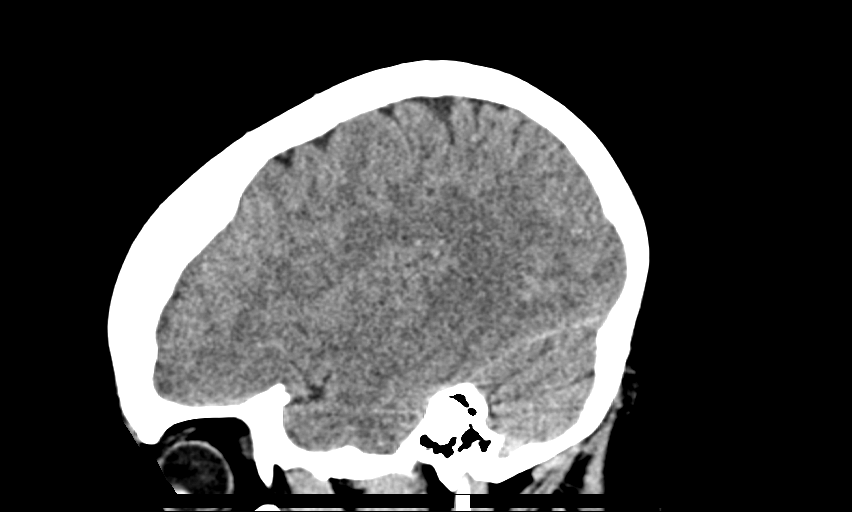
[im 34/67  brain]
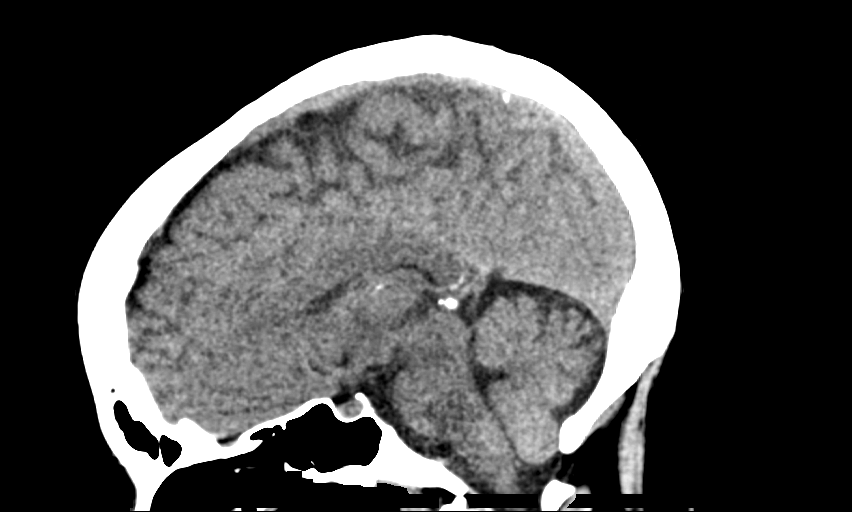
[im 45/67  brain]
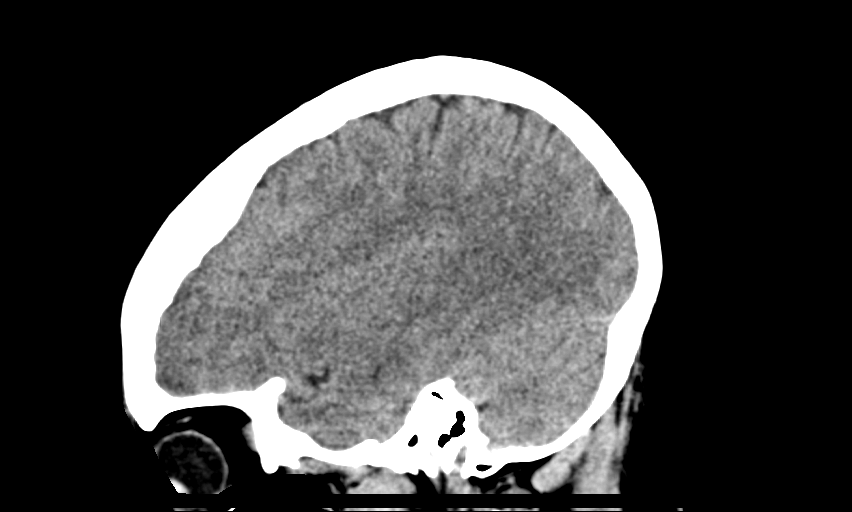

[14 of 47 positions shown; findings below may reference images not displayed]

FINDINGS: Brain: No evidence of acute infarction, hemorrhage, hydrocephalus,
extra-axial collection or mass lesion/mass effect.

Vascular: No hyperdense vessel or unexpected calcification.

Skull: Normal. Negative for fracture or focal lesion.

Sinuses/Orbits: Paranasal sinuses and mastoid air cells are clear.
There is no mastoid effusion. The visualized orbits are
unremarkable.

Other: None.
IMPRESSION: Negative head CT.

## 2022-09-29 ENCOUNTER — Other Ambulatory Visit (HOSPITAL_BASED_OUTPATIENT_CLINIC_OR_DEPARTMENT_OTHER): Payer: Self-pay

## 2022-09-29 MED ORDER — LISDEXAMFETAMINE DIMESYLATE 40 MG PO CAPS
40.0000 mg | ORAL_CAPSULE | Freq: Every morning | ORAL | 0 refills | Status: DC
  Filled 2022-09-29: qty 30, 30d supply, fill #0

## 2022-11-26 ENCOUNTER — Other Ambulatory Visit (HOSPITAL_BASED_OUTPATIENT_CLINIC_OR_DEPARTMENT_OTHER): Payer: Self-pay

## 2022-11-26 MED ORDER — LISDEXAMFETAMINE DIMESYLATE 40 MG PO CAPS
40.0000 mg | ORAL_CAPSULE | Freq: Every morning | ORAL | 0 refills | Status: AC
  Filled 2022-11-26: qty 30, 30d supply, fill #0

## 2022-11-28 ENCOUNTER — Other Ambulatory Visit (HOSPITAL_BASED_OUTPATIENT_CLINIC_OR_DEPARTMENT_OTHER): Payer: Self-pay

## 2022-11-29 ENCOUNTER — Other Ambulatory Visit (HOSPITAL_BASED_OUTPATIENT_CLINIC_OR_DEPARTMENT_OTHER): Payer: Self-pay
